# Patient Record
Sex: Female | Born: 1980 | Race: Black or African American | Hispanic: No | Marital: Single | Smoking: Never smoker
Health system: Southern US, Community
[De-identification: ages and names within clinical notes are randomized; demographics above are authoritative.]

## PROBLEM LIST (undated history)

## (undated) DIAGNOSIS — K760 Fatty (change of) liver, not elsewhere classified: Secondary | ICD-10-CM

## (undated) DIAGNOSIS — F419 Anxiety disorder, unspecified: Secondary | ICD-10-CM

## (undated) DIAGNOSIS — K279 Peptic ulcer, site unspecified, unspecified as acute or chronic, without hemorrhage or perforation: Secondary | ICD-10-CM

---

## 2018-03-23 ENCOUNTER — Emergency Department (HOSPITAL_COMMUNITY)
Admission: EM | Admit: 2018-03-23 | Discharge: 2018-03-25 | Disposition: A | Discharge status: Home / Self Care | Payer: Self-pay | Attending: Emergency Medicine | Admitting: Emergency Medicine

## 2018-03-23 ENCOUNTER — Encounter (HOSPITAL_COMMUNITY): Payer: Self-pay | Admitting: Emergency Medicine

## 2018-03-23 ENCOUNTER — Other Ambulatory Visit: Payer: Self-pay

## 2018-03-23 DIAGNOSIS — F419 Anxiety disorder, unspecified: Secondary | ICD-10-CM | POA: Insufficient documentation

## 2018-03-23 DIAGNOSIS — N390 Urinary tract infection, site not specified: Secondary | ICD-10-CM

## 2018-03-23 DIAGNOSIS — F4323 Adjustment disorder with mixed anxiety and depressed mood: Secondary | ICD-10-CM

## 2018-03-23 DIAGNOSIS — F22 Delusional disorders: Secondary | ICD-10-CM

## 2018-03-23 DIAGNOSIS — E876 Hypokalemia: Secondary | ICD-10-CM

## 2018-03-23 DIAGNOSIS — E860 Dehydration: Secondary | ICD-10-CM

## 2018-03-23 DIAGNOSIS — R000 Tachycardia, unspecified: Secondary | ICD-10-CM | POA: Insufficient documentation

## 2018-03-23 DIAGNOSIS — R4182 Altered mental status, unspecified: Secondary | ICD-10-CM | POA: Insufficient documentation

## 2018-03-23 HISTORY — DX: Peptic ulcer, site unspecified, unspecified as acute or chronic, without hemorrhage or perforation: K27.9

## 2018-03-23 HISTORY — DX: Fatty (change of) liver, not elsewhere classified: K76.0

## 2018-03-23 HISTORY — DX: Anxiety disorder, unspecified: F41.9

## 2018-03-23 LAB — CBC WITH DIFFERENTIAL/PLATELET
Abs Immature Granulocytes: 0.06 10*3/uL (ref 0.00–0.07)
Basophils Absolute: 0.1 10*3/uL (ref 0.0–0.1)
Basophils Relative: 1 %
Eosinophils Absolute: 0 10*3/uL (ref 0.0–0.5)
Eosinophils Relative: 0 %
HCT: 39.2 % (ref 36.0–46.0)
HEMOGLOBIN: 14.1 g/dL (ref 12.0–15.0)
Immature Granulocytes: 1 %
Lymphocytes Relative: 16 %
Lymphs Abs: 1.9 10*3/uL (ref 0.7–4.0)
MCH: 31.2 pg (ref 26.0–34.0)
MCHC: 36 g/dL (ref 30.0–36.0)
MCV: 86.7 fL (ref 80.0–100.0)
MONO ABS: 1 10*3/uL (ref 0.1–1.0)
Monocytes Relative: 9 %
Neutro Abs: 9 10*3/uL — ABNORMAL HIGH (ref 1.7–7.7)
Neutrophils Relative %: 73 %
Platelets: 295 10*3/uL (ref 150–400)
RBC: 4.52 MIL/uL (ref 3.87–5.11)
RDW: 13 % (ref 11.5–15.5)
WBC: 12 10*3/uL — ABNORMAL HIGH (ref 4.0–10.5)
nRBC: 0 % (ref 0.0–0.2)

## 2018-03-23 LAB — COMPREHENSIVE METABOLIC PANEL
ALK PHOS: 64 U/L (ref 38–126)
ALT: 11 U/L (ref 0–44)
AST: 19 U/L (ref 15–41)
Albumin: 4.9 g/dL (ref 3.5–5.0)
Anion gap: 14 (ref 5–15)
BUN: 13 mg/dL (ref 6–20)
CALCIUM: 9.4 mg/dL (ref 8.9–10.3)
CO2: 20 mmol/L — AB (ref 22–32)
Chloride: 103 mmol/L (ref 98–111)
Creatinine, Ser: 1.1 mg/dL — ABNORMAL HIGH (ref 0.44–1.00)
GFR calc Af Amer: >60 mL/min (ref >60)
GFR calc non Af Amer: >60 mL/min (ref >60)
Glucose, Bld: 150 mg/dL — ABNORMAL HIGH (ref 70–99)
Potassium: 2.8 mmol/L — ABNORMAL LOW (ref 3.5–5.1)
Sodium: 137 mmol/L (ref 135–145)
Total Bilirubin: 1.5 mg/dL — ABNORMAL HIGH (ref 0.3–1.2)
Total Protein: 8.1 g/dL (ref 6.5–8.1)

## 2018-03-23 LAB — HCG, QUANTITATIVE, PREGNANCY: hCG, Beta Chain, Quant, S: <1 m[IU]/mL (ref <5)

## 2018-03-23 LAB — ACETAMINOPHEN LEVEL: Acetaminophen (Tylenol), Serum: <10 ug/mL — ABNORMAL LOW (ref 10–30)

## 2018-03-23 LAB — SALICYLATE LEVEL: Salicylate Lvl: <7 mg/dL (ref 2.8–30.0)

## 2018-03-23 LAB — LIPASE, BLOOD: Lipase: 29 U/L (ref 11–51)

## 2018-03-23 LAB — ETHANOL: Alcohol, Ethyl (B): <10 mg/dL (ref <10)

## 2018-03-23 LAB — MAGNESIUM: MAGNESIUM: 1.8 mg/dL (ref 1.7–2.4)

## 2018-03-23 MED ORDER — ONDANSETRON HCL 4 MG/2ML IJ SOLN
4.0000 mg | Freq: Once | INTRAMUSCULAR | Status: DC
  Filled 2018-03-23: qty 2

## 2018-03-23 MED ORDER — LORAZEPAM 2 MG/ML IJ SOLN: 1.0000 mg | Freq: Once | INTRAMUSCULAR | Status: DC

## 2018-03-23 MED ORDER — POTASSIUM CHLORIDE CRYS ER 20 MEQ PO TBCR
40.0000 meq | EXTENDED_RELEASE_TABLET | Freq: Once | ORAL | Status: AC
  Administered 2018-03-23: 40 meq via ORAL
  Filled 2018-03-23: qty 2

## 2018-03-23 MED ORDER — SODIUM CHLORIDE 0.9 % IV BOLUS
1000.0000 mL | Freq: Once | INTRAVENOUS | Status: AC
  Administered 2018-03-23: 1000 mL via INTRAVENOUS

## 2018-03-23 NOTE — ED Notes (Signed)
Pt attempted to give urine sample but unable.

## 2018-03-23 NOTE — ED Triage Notes (Addendum)
Pt here voluntarily with police after she states she heard her family plotting to kill her. Pt went door to door in her neighborhood banging on doors. Pt has a history of anxiety but denies hx of psychosis. Pt mother told [employer] they were worried about her so they went to her apartment to check on her and she had all of her appliances unplugged. When asked about this, pt stated she is in the process of moving. Police were called and pt asked to come here to be treated for dehydration. Pt can not state why she feels dehydrated but she has felt this way for a few days. Pt denies emesis or diarrhea.   Pt's mother Erenest RasherChandra Zwiebel [phone] Pt's cousin Amado NashMaya Shepherd [phone]

## 2018-03-23 NOTE — ED Provider Notes (Signed)
[facility]-EMERGENCY DEPT Provider Note   CSN: [id number] Arrival date & time: 03/23/18  2021     History   Chief Complaint Chief Complaint  Patient presents with  . Medical Clearance  . Dehydration    HPI Suzanne [name] is a 37 y.o. female presenting for evaluation of possible dehydration and abnormal behavior.  Patient arrives with [employer] under voluntary commitment.  Per patient, she is here today because she had an altercation with her family and she is dehydrated.  Patient states that the past several days, she has been feeling nauseous, but no vomiting or abdominal pain.  As such, she has not been eating or drinking very well.  Her last meal was breakfast this morning, which she tolerated without difficulty.  Patient states she has a history of fatty liver disease and peptic ulcer, but takes no medications daily.  She denies recent fevers, chills, chest pain, shortness of breath, abdominal pain, abnormal bowel movements.  When asked if she has any urinary symptoms, patient states she is having dysuria, think she has a UTI.  She denies alcohol, drug, or tobacco use.  Patient states she is not sexually active, denies vaginal discharge. Pt denies a history of mental health problems.  Patient denies SI, HI, or AVH.  Per [employer], they were called to patient's residence for abnormal behavior.  Patient stated that while she was asleep she heard her family planning to kill her.  She subsequently knocked on all the neighbors doors saying that her family was trying to kill her.  When [employer] entered the residents, all of patient's appliances were unplugged, but she could not explain why.  HPI  Past Medical History:  Diagnosis Date  . Anxiety   . Fatty liver   . Peptic ulcer     There are no active problems to display for this patient.   History reviewed. No pertinent surgical history.   OB History   None      Home Medications    Prior to Admission medications   Not  on File    Family History No family history on file.  Social History Social History   Tobacco Use  . Smoking status: Never Smoker  . Smokeless tobacco: Never Used  Substance Use Topics  . Alcohol use: Never    Frequency: Never  . Drug use: Never     Allergies   Patient has no known allergies.   Review of Systems Review of Systems  Gastrointestinal: Positive for nausea.  Genitourinary: Positive for dysuria.  All other systems reviewed and are negative.    Physical Exam Updated Vital Signs BP (!) 139/112 (BP Location: Left Arm)   Pulse (!) 133   Temp 98 F (36.7 C) (Oral)   Resp 18   LMP 02/20/2018   SpO2 99%   Physical Exam  Constitutional: She is oriented to person, place, and time. She appears well-developed and well-nourished. No distress.  Appears nontoxic  HENT:  Head: Normocephalic and atraumatic.  MM moist  Eyes: Pupils are equal, round, and reactive to light. Conjunctivae and EOM are normal.  Neck: Normal range of motion. Neck supple.  Cardiovascular: Regular rhythm and intact distal pulses.  Tachycardic around 120  Pulmonary/Chest: Effort normal and breath sounds normal. No respiratory distress. She has no wheezes.  Abdominal: Soft. She exhibits no distension and no mass. There is no tenderness. There is no rebound and no guarding.  Musculoskeletal: Normal range of motion.  Neurological: She is alert and  oriented to person, place, and time.  Skin: Skin is warm and dry. Capillary refill takes less than 2 seconds.  Psychiatric: Her mood appears anxious. She is not actively hallucinating. She expresses no homicidal and no suicidal ideation. She expresses no suicidal plans and no homicidal plans.  Patient is calm and cooperative when I am in the room, however appears anxious.  Patient repeatedly states her family is trying to kill her.  Nursing note and vitals reviewed.    ED Treatments / Results  Labs (all labs ordered are listed, but only abnormal  results are displayed) Labs Reviewed  CBC WITH DIFFERENTIAL/PLATELET  COMPREHENSIVE METABOLIC PANEL  LIPASE, BLOOD  URINALYSIS, ROUTINE W REFLEX MICROSCOPIC  RAPID URINE DRUG SCREEN, HOSP PERFORMED  ETHANOL  SALICYLATE LEVEL  ACETAMINOPHEN LEVEL  HCG, QUANTITATIVE, PREGNANCY  I-STAT BETA HCG BLOOD, ED ([facility], [facility], [facility] ONLY)    EKG None  Radiology No results found.  Procedures Procedures (including critical care time)  Medications Ordered in ED Medications  sodium chloride 0.9 % bolus 1,000 mL (has no administration in time range)  ondansetron (ZOFRAN) injection 4 mg (4 mg Intravenous Not Given 03/23/18 2131)  LORazepam (ATIVAN) injection 1 mg (has no administration in time range)     Initial Impression / Assessment and Plan / ED Course  I have reviewed the triage vital signs and the nursing notes.  Pertinent labs & imaging results that were available during my care of the patient were reviewed by me and considered in my medical decision making (see chart for details).     Patient presenting for evaluation of abnormal behavior and possible dehydration.  Initial exam shows patient who is tachycardic, but otherwise appears nontoxic.  Tachycardia could be contributed to dehydration, but also consider anxiety for possible drug use.  Patient without mental health history in the medical chart.  Will obtain labs, urine, UDS, and CT head for further evaluation.  Informed by RN that patient was requesting to leave.  Discussed with patient that at this time I feel it is best that she stays in the hospital.  Offered for patient to either stay voluntarily or stated that due to her behavior we would need to take out an IVC. pt agrees to stay voluntarily.   Informed by RN that patient is attempting to leave again and she is refusing any treatment or scans.  IVC paperwork completed, Ativan given to help calm patient.  Pt signed out to Para Skeans, MD for f/u on labs and [facility] consult.    Final  Clinical Impressions(s) / ED Diagnoses   Final diagnoses:  None    ED Discharge Orders    None       Alveria Apley, PA-C 03/23/18 2203    Jacalyn Lefevre, MD 03/23/18 2353

## 2018-03-23 NOTE — ED Notes (Signed)
Pt came out with belongings and stated she was ready to leave/check out. Sophia, PA notified and at bedside speaking with pt.

## 2018-03-23 NOTE — ED Notes (Signed)
Pt attempted to run out of ED. [employer] and Security at bedside.

## 2018-03-24 ENCOUNTER — Emergency Department (HOSPITAL_COMMUNITY): Payer: Self-pay

## 2018-03-24 LAB — URINALYSIS, ROUTINE W REFLEX MICROSCOPIC
BILIRUBIN URINE: NEGATIVE
Glucose, UA: NEGATIVE mg/dL
Ketones, ur: 80 mg/dL — AB
Nitrite: NEGATIVE
Protein, ur: NEGATIVE mg/dL
Specific Gravity, Urine: 1.012 (ref 1.005–1.030)
pH: 6 (ref 5.0–8.0)

## 2018-03-24 LAB — RAPID URINE DRUG SCREEN, HOSP PERFORMED
Amphetamines: NOT DETECTED
Barbiturates: NOT DETECTED
Benzodiazepines: NOT DETECTED
Cocaine: NOT DETECTED
Opiates: NOT DETECTED
Tetrahydrocannabinol: NOT DETECTED

## 2018-03-24 LAB — POTASSIUM: POTASSIUM: 3 mmol/L — AB (ref 3.5–5.1)

## 2018-03-24 MED ORDER — LORAZEPAM 1 MG PO TABS
1.0000 mg | ORAL_TABLET | ORAL | Status: AC | PRN
  Administered 2018-03-24: 1 mg via ORAL
  Filled 2018-03-24: qty 1

## 2018-03-24 MED ORDER — RISPERIDONE 1 MG PO TBDP: 2.0000 mg | ORAL_TABLET | Freq: Three times a day (TID) | ORAL | Status: DC | PRN

## 2018-03-24 MED ORDER — POTASSIUM CHLORIDE CRYS ER 20 MEQ PO TBCR: 40.0000 meq | EXTENDED_RELEASE_TABLET | Freq: Once | ORAL | Status: DC

## 2018-03-24 MED ORDER — POTASSIUM CHLORIDE CRYS ER 20 MEQ PO TBCR
40.0000 meq | EXTENDED_RELEASE_TABLET | Freq: Two times a day (BID) | ORAL | Status: DC
  Administered 2018-03-24: 40 meq via ORAL
  Filled 2018-03-24: qty 2

## 2018-03-24 MED ORDER — ACETAMINOPHEN 325 MG PO TABS: 650.0000 mg | ORAL_TABLET | ORAL | Status: DC | PRN

## 2018-03-24 MED ORDER — ZOLPIDEM TARTRATE 5 MG PO TABS: 5.0000 mg | ORAL_TABLET | Freq: Every evening | ORAL | Status: DC | PRN

## 2018-03-24 MED ORDER — LORAZEPAM 1 MG PO TABS
1.0000 mg | ORAL_TABLET | Freq: Four times a day (QID) | ORAL | Status: DC
  Administered 2018-03-24 – 2018-03-25 (×4): 1 mg via ORAL
  Filled 2018-03-24 (×5): qty 1

## 2018-03-24 MED ORDER — ONDANSETRON HCL 4 MG PO TABS: 4.0000 mg | ORAL_TABLET | Freq: Three times a day (TID) | ORAL | Status: DC | PRN

## 2018-03-24 MED ORDER — ZIPRASIDONE MESYLATE 20 MG IM SOLR: 20.0000 mg | INTRAMUSCULAR | Status: DC | PRN

## 2018-03-24 MED ORDER — POTASSIUM CHLORIDE CRYS ER 20 MEQ PO TBCR
40.0000 meq | EXTENDED_RELEASE_TABLET | Freq: Once | ORAL | Status: AC
  Administered 2018-03-25: 40 meq via ORAL
  Filled 2018-03-24: qty 2

## 2018-03-24 NOTE — ED Notes (Signed)
Pt alert and cooperative at this time. Pt denies any si, hi, or avh. Pt resting in bed at this time. Will continue to monitor.

## 2018-03-24 NOTE — ED Notes (Signed)
Pt finally reluctantly took her medications.  Pt continues to appear very paranoid.

## 2018-03-24 NOTE — ED Notes (Signed)
Phlebotomy called to obtain potasium.

## 2018-03-24 NOTE — ED Notes (Signed)
Bed: [facility]39 Expected date:  Expected time:  Means of arrival:  Comments: 

## 2018-03-24 NOTE — ED Notes (Signed)
Pt would not talk with the providers when they made rounds. As they asked questions she remained silent. At one point she said, "Come back in 5 minutes." Unable to assess pt at this time. Pt has remained in bed except to ask for clean socks and use the bathroom.

## 2018-03-24 NOTE — ED Notes (Signed)
Pt pulled IV out 

## 2018-03-24 NOTE — ED Notes (Signed)
Refused vitals 

## 2018-03-24 NOTE — ED Notes (Signed)
Came up to nurses station asking to call her mom that she had to get out of here. Informed she was on a petition and she couldn't leave now. Asked her if she would like to reconsider taking something for her anxiety as she looks anxious and it will also help her sleep and she can talk with the Dr in the am and make her case re her desire to leave. She agreed to take meds and was given a po Ativan. She reported she has OCD in the form of order and it was bothering now. She denied any hallucinations and she denied SI and HI. Offered several self soothing skills ie showering, hot pack, TV or a magazine. She asked to take a shower and told to come to station to get hygiene items but never came up.

## 2018-03-24 NOTE — ED Notes (Signed)
Up to use the bathroom, still has not provided an urine sample. Asked her if she felt better and said she did, but she continues to be tremulous in bed and is slow to respond but denies any sx of psychosis and denies dangerousness. Asked if she would like to take medicine now to help her sleep and she declined saying she was fine. Resting quietly but awake in bed.

## 2018-03-24 NOTE — ED Notes (Signed)
Pt was asked to provide a urine sample. Pt states she will provide one shortly

## 2018-03-24 NOTE — BH Assessment (Signed)
[name] Patient's mother Erenest RasherChandra Vanzandt [phone] spoke with this writer this date to render collateral information on patient. Mother reports she has not heard from her daughter in over one week and when mother drove down from [place] a scheduled visit (Thanksgiving) daughter was found at her apartment presenting with a very bizarre affect and was observed to be very paranoid asking mother to turn off all her electronics due to patient reporting her phone calls were being monitored. Mother also reported all the electrical appliances in the residence were unplugged. Mother reported patient look disheveled and observed notable weight loss since she last saw the patient a month ago. Mother reports patient had put some of her electronic devices out in her yard. When mother questioned patient mother reported patient became upset and went to the neighbors residence who contacted EMS after patient started voicing her paranoia to neighbors.

## 2018-03-24 NOTE — ED Notes (Signed)
Attempted to give medications and to obtain urine.  Pt acts as if she is going to comply and suddenly comes up with an excuse to prolong the wait.  Pt states " I need about an hour and a half to get myself together, then I'll take the meds."   Pt is standing in hallway holding a cup of water and a cup of ginger ale.  She is very bizarre.  We will continue to try to get pt to cooperate.

## 2018-03-24 NOTE — ED Notes (Signed)
Admitted to room 39 from ED. She is tremulous, reports highly anxious, indecisive, worrisome. She accepted food second time offered. Offered her something for anxiety but hesitant to take it, when order checked IM only waiting on Dr for a po order. Cooperative, but nervous. Resting in bed. Able to contract for safety at this time.

## 2018-03-24 NOTE — BH Assessment (Addendum)
Assessment Note  Suzanne [name] is an 37 y.o. female, who [facility] Pt was a poor historian during the assessment. Pt answered very few questions during the assessment. Clinician asked the pt, "what brought you to the hospital?" Pt took a deep breath, closed her eyes and after a long pause, reported, "I had an altercation with my family, I got scared, I asked the police to take me here." Clinician asked the pt, "what made you scared?" Pt closed her eyes and did not answer the question when prompted. Pt denies, SI and HI.  Clinician was unable to assess the following: "martial status, legal guardian, living situation, linkage to OPT resources, previous suicide attempts, legal involvement, stressors, family supports, sleep, appetite, history of abuse, substance use, symptoms of depression, previous inpatient admissions, access to weapons, AVH, self-injurious behaviors, contract for safety, impulse control, DSS involvement, family history of suicide."    Pt presents quiet/awake in scrubs under the covers with logical/coherent speech. Pt's eye contact was poor. Pt's mood was preoccupied. Pt's affect was flat. Pt's thought process was circumstantial. Pt's judgement was impaired. Pt was oriented x2. Pt's concentration and insight are poor.   Diagnosis: Deferred.  Past Medical History:  Past Medical History:  Diagnosis Date  . Anxiety   . Fatty liver   . Peptic ulcer     History reviewed. No pertinent surgical history.  Family History: No family history on file.  Social History:  reports that she has never smoked. She has never used smokeless tobacco. She reports that she does not drink alcohol or use drugs.  Additional Social History:  Alcohol / Drug Use Pain Medications: See MAR Prescriptions: See MAR Over the Counter: See MAR History of alcohol / drug use?: (UTA)  CIWA: CIWA-Ar BP: (!) 139/112 Pulse Rate: (!) 133 COWS:    Allergies: No Known  Allergies  Home Medications:  (Not in a hospital admission)  OB/GYN Status:  Patient's last menstrual period was 02/20/2018.  General Assessment Data Location of Assessment: [facility] ED TTS Assessment: In system Is this a Tele or Face-to-Face Assessment?: Face-to-Face Is this an Initial Assessment or a Re-assessment for this encounter?: Initial Assessment Patient Accompanied by:: N/A Language Other than English: No Living Arrangements: Other (Comment)(UTA) What gender do you identify as?: Female Marital status: Other (comment)(UTA) Living Arrangements: Other (Comment)(UTA) Can pt return to current living arrangement?: (UTA) Admission Status: Involuntary Petitioner: Police Is patient capable of signing voluntary admission?: No Referral Source: Self/Family/Friend Insurance type: Self-pay.      Crisis Care Plan Living Arrangements: Other (Comment)(UTA) Legal Guardian: Other:(UTA) Name of Psychiatrist: UTA Name of Therapist: UTA  Education Status Is patient currently in school?: No Is the patient employed, unemployed or receiving disability?: (UTA)  Risk to self with the past 6 months Suicidal Ideation: No(Pt denies. ) Has patient been a risk to self within the past 6 months prior to admission? : No Suicidal Intent: No Has patient had any suicidal intent within the past 6 months prior to admission? : No Is patient at risk for suicide?: No Suicidal Plan?: No Has patient had any suicidal plan within the past 6 months prior to admission? : No Access to Means: No What has been your use of drugs/alcohol within the last 12 months?: UDS is pending.  Previous Attempts/Gestures: (UTA) How many times?: (UTA) Other Self Harm Risks: UTA Intentional Self Injurious Behavior: (UTA) Family Suicide History: Unable to assess Recent stressful life event(s): Other (Comment)(UTA) Persecutory voices/beliefs?: Yes(Per chart,  thinking her family is trying to kill her. ) Depression:  (UTA) Substance abuse history and/or treatment for substance abuse?: (UTA) Suicide prevention information given to non-admitted patients: Not applicable  Risk to Others within the past 6 months Homicidal Ideation: No(Pt denies. ) Does patient have any lifetime risk of violence toward others beyond the six months prior to admission? : (UTA) Thoughts of Harm to Others: No Current Homicidal Intent: No Current Homicidal Plan: No Access to Homicidal Means: (UTA) Identified Victim: NA History of harm to others?: (UTA) Assessment of Violence: (UTA) Violent Behavior Description: UTA Does patient have access to weapons?: (UTA) Criminal Charges Pending?: (UTA) Does patient have a court date: (UTA) Is patient on probation?: (UTA)     Mental Status Report Appearance/Hygiene: In scrubs Eye Contact: Poor Motor Activity: Unremarkable Speech: Logical/coherent Level of Consciousness: Quiet/awake Mood: Preoccupied Affect: Flat Anxiety Level: Minimal Thought Processes: Circumstantial Judgement: Impaired Orientation: Person, Place Obsessive Compulsive Thoughts/Behaviors: None  Cognitive Functioning Concentration: Poor Memory: Recent Impaired, Remote Impaired Is patient IDD: No Insight: Poor Impulse Control: Unable to Assess Appetite: (UTA) Sleep: Unable to Assess Vegetative Symptoms: Unable to Assess  ADLScreening [facility] Assessment Services) Patient's cognitive ability adequate to safely complete daily activities?: Yes Patient able to express need for assistance with ADLs?: Yes Independently performs ADLs?: Yes (appropriate for developmental age)  Prior Inpatient Therapy Prior Inpatient Therapy: (UTA)  Prior Outpatient Therapy Prior Outpatient Therapy: (UTA)  ADL Screening (condition at time of admission) Patient's cognitive ability adequate to safely complete daily activities?: Yes Is the patient deaf or have difficulty hearing?: No Does the patient have difficulty seeing, even  when wearing glasses/contacts?: Yes(Pt wears glasses. ) Does the patient have difficulty concentrating, remembering, or making decisions?: Yes Patient able to express need for assistance with ADLs?: Yes Does the patient have difficulty dressing or bathing?: No Independently performs ADLs?: Yes (appropriate for developmental age) Does the patient have difficulty walking or climbing stairs?: No Weakness of Legs: (UTA) Weakness of Arms/Hands: (UTA)  Home Assistive Devices/Equipment Home Assistive Devices/Equipment: Eyeglasses    Abuse/Neglect Assessment (Assessment to be complete while patient is alone) Abuse/Neglect Assessment Can Be Completed: Unable to assess, patient is non-responsive or altered mental status     Advance Directives (For Healthcare) Does Patient Have a Medical Advance Directive?: No Would patient like information on creating a medical advance directive?: No - Patient declined          Disposition: Nira Conn, NP recommends inpatient treatment. Disposition discussed with Dr. Rhunette Croft and Fannie Knee, RN. TTS to seek placement.   Disposition Initial Assessment Completed for this Encounter: Yes  On Site Evaluation by: Redmond Pulling, MS, LPC, CRC. Reviewed with Physician: Dr. Marja Kays and Nira Conn, NP.  Redmond Pulling 03/24/2018 1:54 AM   Redmond Pulling, MS, [facility], [facility] Triage Specialist [phone]

## 2018-03-25 DIAGNOSIS — N390 Urinary tract infection, site not specified: Secondary | ICD-10-CM | POA: Diagnosis present

## 2018-03-25 DIAGNOSIS — F4323 Adjustment disorder with mixed anxiety and depressed mood: Secondary | ICD-10-CM | POA: Diagnosis present

## 2018-03-25 MED ORDER — CEPHALEXIN 500 MG PO CAPS: 500.0000 mg | ORAL_CAPSULE | Freq: Three times a day (TID) | ORAL | 0 refills | Status: DC

## 2018-03-25 MED ORDER — CEPHALEXIN 500 MG PO CAPS
500.0000 mg | ORAL_CAPSULE | Freq: Three times a day (TID) | ORAL | Status: DC
  Administered 2018-03-25: 500 mg via ORAL
  Filled 2018-03-25: qty 1

## 2018-03-25 NOTE — ED Notes (Addendum)
Attempted to take vital signs on patient in hallway.  Social worker walked by and heard that patient was having a difficult time getting home.  Social worker, Claris GowerCharlotte, offered to get involved in her discharge and help with transportation.  Patient then began to say that she felt too bad to go home and she wants to see her medical records and did we check with her doctors to see what mediation she was on.  Patient reassured that pharmacy does gather information as to what medications patients are on.  Patient also reports she wants to talk to the doctor about not going home.  Claris GowerCharlotte, Child psychotherapistsocial worker, said she could ask but could not promise she could get doctors to talk with her.

## 2018-03-25 NOTE — ED Notes (Signed)
When talking to patient about her discharge and reviewing her discharge medications, patient said to nurse, "will you just back off I feel like I am going to have a panic attack." "Just give me a few minutes."  Nurse was trying to get information about how patient was going to get home as well.  Patient answered no when asked if she had anyone to pick her up.

## 2018-03-25 NOTE — ED Notes (Addendum)
Patient not eating any of her lunch.  Encouraged fluids.

## 2018-03-25 NOTE — Consult Note (Addendum)
[facility]  03/25/2018 2:44 PM Suzanne [name]  MRN:  [id number] Principal Problem: Adjustment disorder with mixed anxiety and depressed mood Discharge Diagnoses: Principal Problem:   Adjustment disorder with mixed anxiety and depressed mood Active Problems:   UTI (urinary tract infection)  Subjective: 37 yo female who presented to the ED with dehydration and an altercation with her family.  She also had an UTI and was treated along with her dehydration.  This morning she was still a bit weak but this afternoon after many fluids and food, she is better and in her room looking at a magazine.  Yesterday she thought her family was out to get her due to her medical state.  Today, those symptoms dissipated and she feels better.  No paranoia or suicidal/homicidal ideations, hallucinations, or substance abuse.  Her paranoia started yesterday with her dehydration and dissipated after potassium and fluids.  Denies this has ever happened in the past, stable for discharge.  Total Time spent with patient: 45 minutes  Past Psychiatric History: anxiety  Past Medical History:  Past Medical History:  Diagnosis Date  . Anxiety   . Fatty liver   . Peptic ulcer    History reviewed. No pertinent surgical history. Family History: No family history on file. Family Psychiatric  History: none Social History:  Social History   Substance and Sexual Activity  Alcohol Use Never  . Frequency: Never    Social History   Substance and Sexual Activity  Drug Use Never   Social History   Socioeconomic History  . Marital status: Single    Spouse name: Not on file  . Number of children: Not on file  . Years of education: Not on file  . Highest education level: Not on file  Occupational History  . Not on file  Social Needs  . Financial resource strain: Not on file  . Food insecurity:    Worry: Not on file    Inability: Not on file  . Transportation needs:    Medical: Not on file    Non-medical:  Not on file  Tobacco Use  . Smoking status: Never Smoker  . Smokeless tobacco: Never Used  Substance and Sexual Activity  . Alcohol use: Never    Frequency: Never  . Drug use: Never  . Sexual activity: Not on file  Lifestyle  . Physical activity:    Days per week: Not on file    Minutes per session: Not on file  . Stress: Not on file  Relationships  . Social connections:    Talks on phone: Not on file    Gets together: Not on file    Attends religious service: Not on file    Active member of club or organization: Not on file    Attends meetings of clubs or organizations: Not on file    Relationship status: Not on file  Other Topics Concern  . Not on file  Social History Narrative  . Not on file    Has this patient used any form of tobacco in the last 30 days? (Cigarettes, Smokeless Tobacco, Cigars, and/or Pipes) NA  Current Medications: Current Facility-Administered Medications  Medication Dose Route Frequency Provider Last Rate Last Dose  . acetaminophen (TYLENOL) tablet 650 mg  650 mg Oral Q4H PRN Nanavati, Ankit, MD      . cephALEXin (KEFLEX) capsule 500 mg  500 mg Oral Q8H Charm RingsLord, Jamison Y, NP   500 mg at 03/25/18 1032  . LORazepam (ATIVAN) tablet  1 mg  1 mg Oral Q6H Laveda Abbe, NP   1 mg at 03/25/18 1215  . ondansetron (ZOFRAN) injection 4 mg  4 mg Intravenous Once Caccavale, Sophia, PA-C      . ondansetron (ZOFRAN) tablet 4 mg  4 mg Oral Q8H PRN Derwood Kaplan, MD       No current outpatient medications on file.   PTA Medications:  (Not in a hospital admission)  Musculoskeletal: Strength & Muscle Tone: within normal limits Gait & Station: normal Patient leans: N/A  Psychiatric Specialty Exam: Physical Exam  Nursing note and vitals reviewed. Constitutional: She is oriented to person, place, and time. She appears well-developed and well-nourished.  HENT:  Head: Normocephalic.  Neck: Normal range of motion.  Respiratory: Effort normal.   Musculoskeletal: Normal range of motion.  Neurological: She is alert and oriented to person, place, and time.  Psychiatric: Her speech is normal and behavior is normal. Judgment and thought content normal. Her mood appears anxious. Cognition and memory are normal. She exhibits a depressed mood.    Review of Systems  Genitourinary: Positive for dysuria, frequency and urgency.  Psychiatric/Behavioral: Positive for depression. The patient is nervous/anxious.   All other systems reviewed and are negative.   Blood pressure 107/82, pulse 80, temperature 98.2 F (36.8 C), temperature source Oral, resp. rate 14, last menstrual period 02/20/2018, SpO2 99 %.There is no height or weight on file to calculate BMI.  General Appearance: Casual  Eye Contact:  Good  Speech:  Normal Rate  Volume:  Normal  Mood:  Anxious and Depressed, mild  Affect:  Congruent  Thought Process:  Coherent and Descriptions of Associations: Intact  Orientation:  Full (Time, Place, and Person)  Thought Content:  WDL and Logical  Suicidal Thoughts:  No  Homicidal Thoughts:  No  Memory:  Immediate;   Good Recent;   Good Remote;   Good  Judgement:  Fair  Insight:  Good  Psychomotor Activity:  Normal  Concentration:  Concentration: Good and Attention Span: Good  Recall:  Good  Fund of Knowledge:  Good  Language:  Good  Akathisia:  No  Handed:  Right  AIMS (if indicated):     Assets:  Leisure Time Physical Health Resilience Social Support  ADL's:  Intact  Cognition:  WNL  Sleep:        Demographic Factors:  Living alone  Loss Factors: NA  Historical Factors: NA  Risk Reduction Factors:   Sense of responsibility to family, Employed and Positive social support  Continued Clinical Symptoms:  Depression and anxiety, mild  Cognitive Features That Contribute To Risk:  None    Suicide Risk:  Minimal: No identifiable suicidal ideation.  Patients presenting with no risk factors but with morbid  ruminations; may be classified as minimal risk based on the severity of the depressive symptoms   Plan Of Care/Follow-up recommendations:  Adjustment disorder with mixed disturbance of depression and anxiety:  UTI: -Started Keflex 500 mg every 8 hours for ten days  Dehydration: -Potassium provided Activity:  as tolerated Diet:  heart healthy diet  Disposition: discharge home   Nanine Means, NP 03/25/2018, 2:44 PM  Patient seen face-to-face for psychiatric evaluation, chart reviewed and case discussed with the physician extender and developed treatment plan. Reviewed the information documented and agree with the treatment plan. Thedore Mins, MD

## 2018-03-25 NOTE — Progress Notes (Signed)
Subjective/Objective No new subjective & objective note has been filed under this hospital service since the last note was generated.   Scheduled Meds: . cephALEXin  500 mg Oral Q8H  . LORazepam  1 mg Oral Q6H  . ondansetron (ZOFRAN) IV  4 mg Intravenous Once   Continuous Infusions: PRN Meds:acetaminophen, ondansetron  Vital signs in last 24 hours: Temp:  [98.2 F (36.8 C)] 98.2 F (36.8 C) (11/30 16100632) Pulse Rate:  [80] 80 (11/30 0632) Resp:  [14] 14 (11/30 96040632) BP: (107)/(82) 107/82 (11/30 54090632) SpO2:  [99 %] 99 % (11/30 81190632)  Intake/Output last 3 shifts: No intake/output data recorded. Intake/Output this shift: No intake/output data recorded.  Problem Assessment/Plan No new Assessment & Plan notes have been filed under this hospital service since the last note was generated. Service: Psychiatry

## 2018-03-25 NOTE — ED Notes (Signed)
Patient's reluctance to go home communicated to NP, Nanine MeansJamison Lord.  NP said to continue with discharge.

## 2018-03-25 NOTE — Progress Notes (Signed)
CSW and nursing spoke with patient in hallway, patient stating she refuses to be discharged. Patient initially stated she had no way to get home, CSW offered bus pass or taxi voucher, patient declined. Patient demanding to speak with doctor and view her medical records, CSW explained that patient would be able to request her medical records following discharge.  Suzanne Cutterharlotte Telly Broberg, LCSW-A Clinical Social Worker [phone]

## 2018-03-26 ENCOUNTER — Other Ambulatory Visit: Payer: Self-pay

## 2018-03-26 ENCOUNTER — Emergency Department (HOSPITAL_COMMUNITY)
Admission: EM | Admit: 2018-03-26 | Discharge: 2018-03-26 | Disposition: A | Discharge status: Other Acute Inpatient Hospital | Payer: Self-pay | Attending: Emergency Medicine | Admitting: Emergency Medicine

## 2018-03-26 ENCOUNTER — Inpatient Hospital Stay (HOSPITAL_COMMUNITY)
Admission: AD | Admit: 2018-03-26 | Discharge: 2018-04-04 | DRG: 885 | Disposition: A | Discharge status: Home / Self Care | Payer: Federal, State, Local not specified - Other | Source: Intra-hospital | Attending: Psychiatry | Admitting: Psychiatry

## 2018-03-26 ENCOUNTER — Encounter (HOSPITAL_COMMUNITY): Payer: Self-pay | Admitting: Behavioral Health

## 2018-03-26 DIAGNOSIS — F209 Schizophrenia, unspecified: Secondary | ICD-10-CM | POA: Diagnosis present

## 2018-03-26 DIAGNOSIS — K760 Fatty (change of) liver, not elsewhere classified: Secondary | ICD-10-CM | POA: Diagnosis present

## 2018-03-26 DIAGNOSIS — F333 Major depressive disorder, recurrent, severe with psychotic symptoms: Secondary | ICD-10-CM | POA: Diagnosis not present

## 2018-03-26 DIAGNOSIS — F418 Other specified anxiety disorders: Secondary | ICD-10-CM | POA: Diagnosis present

## 2018-03-26 DIAGNOSIS — Z9119 Patient's noncompliance with other medical treatment and regimen: Secondary | ICD-10-CM | POA: Insufficient documentation

## 2018-03-26 DIAGNOSIS — E876 Hypokalemia: Secondary | ICD-10-CM | POA: Diagnosis present

## 2018-03-26 DIAGNOSIS — Z590 Homelessness: Secondary | ICD-10-CM | POA: Diagnosis not present

## 2018-03-26 DIAGNOSIS — R45851 Suicidal ideations: Secondary | ICD-10-CM | POA: Insufficient documentation

## 2018-03-26 DIAGNOSIS — R419 Unspecified symptoms and signs involving cognitive functions and awareness: Secondary | ICD-10-CM | POA: Insufficient documentation

## 2018-03-26 DIAGNOSIS — Z79899 Other long term (current) drug therapy: Secondary | ICD-10-CM | POA: Insufficient documentation

## 2018-03-26 DIAGNOSIS — R000 Tachycardia, unspecified: Secondary | ICD-10-CM | POA: Insufficient documentation

## 2018-03-26 LAB — COMPREHENSIVE METABOLIC PANEL
ALK PHOS: 59 U/L (ref 38–126)
ALT: 17 U/L (ref 0–44)
AST: 45 U/L — ABNORMAL HIGH (ref 15–41)
Albumin: 4.9 g/dL (ref 3.5–5.0)
Anion gap: 16 — ABNORMAL HIGH (ref 5–15)
BUN: 18 mg/dL (ref 6–20)
CO2: 21 mmol/L — ABNORMAL LOW (ref 22–32)
Calcium: 9.7 mg/dL (ref 8.9–10.3)
Chloride: 103 mmol/L (ref 98–111)
Creatinine, Ser: 0.92 mg/dL (ref 0.44–1.00)
GFR calc Af Amer: >60 mL/min (ref >60)
GFR calc non Af Amer: >60 mL/min (ref >60)
Glucose, Bld: 104 mg/dL — ABNORMAL HIGH (ref 70–99)
Potassium: 3.2 mmol/L — ABNORMAL LOW (ref 3.5–5.1)
SODIUM: 140 mmol/L (ref 135–145)
Total Bilirubin: 1.4 mg/dL — ABNORMAL HIGH (ref 0.3–1.2)
Total Protein: 7.7 g/dL (ref 6.5–8.1)

## 2018-03-26 LAB — CBC WITH DIFFERENTIAL/PLATELET
ABS IMMATURE GRANULOCYTES: 0.05 10*3/uL (ref 0.00–0.07)
Basophils Absolute: 0.1 10*3/uL (ref 0.0–0.1)
Basophils Relative: 0 %
Eosinophils Absolute: 0 10*3/uL (ref 0.0–0.5)
Eosinophils Relative: 0 %
HCT: 35.8 % — ABNORMAL LOW (ref 36.0–46.0)
Hemoglobin: 12.5 g/dL (ref 12.0–15.0)
Immature Granulocytes: 0 %
Lymphocytes Relative: 14 %
Lymphs Abs: 1.8 10*3/uL (ref 0.7–4.0)
MCH: 30.9 pg (ref 26.0–34.0)
MCHC: 34.9 g/dL (ref 30.0–36.0)
MCV: 88.4 fL (ref 80.0–100.0)
Monocytes Absolute: 1.3 10*3/uL — ABNORMAL HIGH (ref 0.1–1.0)
Monocytes Relative: 10 %
Neutro Abs: 9.9 10*3/uL — ABNORMAL HIGH (ref 1.7–7.7)
Neutrophils Relative %: 76 %
Platelets: 245 10*3/uL (ref 150–400)
RBC: 4.05 MIL/uL (ref 3.87–5.11)
RDW: 13.4 % (ref 11.5–15.5)
WBC: 13.1 10*3/uL — ABNORMAL HIGH (ref 4.0–10.5)
nRBC: 0 % (ref 0.0–0.2)

## 2018-03-26 LAB — ETHANOL: Alcohol, Ethyl (B): <10 mg/dL (ref <10)

## 2018-03-26 LAB — I-STAT BETA HCG BLOOD, ED (MC, WL, AP ONLY): I-stat hCG, quantitative: <5 m[IU]/mL (ref <5)

## 2018-03-26 MED ORDER — CITALOPRAM HYDROBROMIDE 10 MG PO TABS
10.0000 mg | ORAL_TABLET | Freq: Every day | ORAL | Status: DC
  Filled 2018-03-26: qty 1

## 2018-03-26 MED ORDER — RISPERIDONE 0.5 MG PO TABS
0.5000 mg | ORAL_TABLET | Freq: Two times a day (BID) | ORAL | Status: DC
  Filled 2018-03-26: qty 1

## 2018-03-26 MED ORDER — MAGNESIUM HYDROXIDE 400 MG/5ML PO SUSP: 30.0000 mL | Freq: Every day | ORAL | Status: DC | PRN

## 2018-03-26 MED ORDER — ACETAMINOPHEN 325 MG PO TABS: 650.0000 mg | ORAL_TABLET | Freq: Four times a day (QID) | ORAL | Status: DC | PRN

## 2018-03-26 MED ORDER — POTASSIUM CHLORIDE 10 MEQ/100ML IV SOLN
10.0000 meq | INTRAVENOUS | Status: DC
  Filled 2018-03-26 (×4): qty 100

## 2018-03-26 MED ORDER — ALUM & MAG HYDROXIDE-SIMETH 200-200-20 MG/5ML PO SUSP: 30.0000 mL | ORAL | Status: DC | PRN

## 2018-03-26 MED ORDER — LORAZEPAM 1 MG PO TABS
1.0000 mg | ORAL_TABLET | Freq: Once | ORAL | Status: AC
  Administered 2018-03-26: 1 mg via ORAL
  Filled 2018-03-26: qty 1

## 2018-03-26 MED ORDER — TRAZODONE HCL 50 MG PO TABS: 50.0000 mg | ORAL_TABLET | Freq: Every evening | ORAL | Status: DC | PRN

## 2018-03-26 NOTE — Tx Team (Signed)
Initial Treatment Plan 03/26/2018 6:30 PM Suzanne [name] WUJ:[id number]    PATIENT STRESSORS: Marital or family conflict Medication change or noncompliance   PATIENT STRENGTHS: Ability for insight Capable of independent living   PATIENT IDENTIFIED PROBLEMS: "family conflict"  "money"  "mood swings"                 DISCHARGE CRITERIA:  Ability to meet basic life and health needs Adequate post-discharge living arrangements Improved stabilization in mood, thinking, and/or behavior  PRELIMINARY DISCHARGE PLAN: Attend aftercare/continuing care group Attend PHP/IOP  PATIENT/FAMILY INVOLVEMENT: This treatment plan has been presented to and reviewed with the patient, Suzanne [name], and/or family member.  The patient and family have been given the opportunity to ask questions and make suggestions.  Layla BarterWhite, Dorothe Elmore L, RN 03/26/2018, 6:30 PM

## 2018-03-26 NOTE — ED Provider Notes (Signed)
[facility]-EMERGENCY DEPT Provider Note   CSN: [id number] Arrival date & time: 03/26/18  1220     History   Chief Complaint Chief Complaint  Patient presents with  . Suicidal    HPI Suzanne [name] is a 37 y.o. female.  The history is provided by the patient.  Mental Health Problem  Presenting symptoms: suicidal thoughts and suicide attempt   Patient accompanied by:  Law enforcement Degree of incapacity (severity):  Severe Onset quality:  Sudden Timing:  Constant Progression:  Unchanged Chronicity:  Recurrent Context: noncompliance   Treatment compliance:  Untreated Relieved by:  Nothing Worsened by:  Family interactions Associated symptoms: feelings of worthlessness and poor judgment   Associated symptoms: no abdominal pain, no anhedonia, no anxiety, no appetite change, no chest pain, not distractible, no euphoric mood, no headaches, no hypersomnia, no hyperventilation and no insomnia   Risk factors: hx of mental illness     Past Medical History:  Diagnosis Date  . Anxiety   . Fatty liver   . Peptic ulcer     Patient Active Problem List   Diagnosis Date Noted  . UTI (urinary tract infection) 03/25/2018  . Adjustment disorder with mixed anxiety and depressed mood 03/25/2018    No past surgical history on file.   OB History   None      Home Medications    Prior to Admission medications   Medication Sig Start Date End Date Taking? Authorizing Provider  cephALEXin (KEFLEX) 500 MG capsule Take 1 capsule (500 mg total) by mouth every 8 (eight) hours. 03/25/18   Charm RingsLord, Jamison Y, NP    Family History No family history on file.  Social History Social History   Tobacco Use  . Smoking status: Never Smoker  . Smokeless tobacco: Never Used  Substance Use Topics  . Alcohol use: Not Currently    Frequency: Never    Comment: Last use was March 2019  . Drug use: Never     Allergies   Patient has no known allergies.   Review of  Systems Review of Systems  Constitutional: Negative for appetite change, chills and fever.  HENT: Negative for ear pain and sore throat.   Eyes: Negative for pain and visual disturbance.  Respiratory: Negative for cough and shortness of breath.   Cardiovascular: Negative for chest pain and palpitations.  Gastrointestinal: Negative for abdominal pain and vomiting.  Genitourinary: Negative for dysuria and hematuria.  Musculoskeletal: Negative for arthralgias and back pain.  Skin: Negative for color change and rash.  Neurological: Negative for seizures, syncope and headaches.  Psychiatric/Behavioral: Positive for suicidal ideas. The patient is not nervous/anxious and does not have insomnia.   All other systems reviewed and are negative.    Physical Exam Updated Vital Signs  ED Triage Vitals  Enc Vitals Group     BP 03/26/18 1302 111/77     Pulse Rate 03/26/18 1302 (!) 120     Resp 03/26/18 1302 16     Temp 03/26/18 1305 97.7 F (36.5 C)     Temp Source 03/26/18 1305 Oral     SpO2 03/26/18 1302 98 %     Weight 03/26/18 1316 180 lb (81.6 kg)     Height 03/26/18 1316 5\' 4"  (1.626 m)     Head Circumference --      Peak Flow --      Pain Score 03/26/18 1303 0     Pain Loc --      Pain Edu? --  Excl. in GC? --     Physical Exam  Constitutional: She is oriented to person, place, and time. She appears well-developed and well-nourished. No distress.  HENT:  Head: Normocephalic and atraumatic.  Eyes: Pupils are equal, round, and reactive to light. Conjunctivae and EOM are normal.  Neck: Normal range of motion. Neck supple.  Cardiovascular: Regular rhythm. Tachycardia present.  No murmur heard. Pulmonary/Chest: Effort normal and breath sounds normal. No respiratory distress.  Abdominal: Soft. Bowel sounds are normal. She exhibits no distension. There is no tenderness.  Musculoskeletal: Normal range of motion. She exhibits no edema.  Neurological: She is alert and oriented to  person, place, and time.  Skin: Skin is warm and dry. Capillary refill takes less than 2 seconds.  Psychiatric: Her mood appears anxious. She expresses impulsivity. She exhibits a depressed mood. She expresses suicidal ideation. She expresses no homicidal ideation. She expresses no suicidal plans and no homicidal plans.  Nursing note and vitals reviewed.    ED Treatments / Results  Labs (all labs ordered are listed, but only abnormal results are displayed) Labs Reviewed  COMPREHENSIVE METABOLIC PANEL - Abnormal; Notable for the following components:      Result Value   Potassium 3.2 (*)    CO2 21 (*)    Glucose, Bld 104 (*)    AST 45 (*)    Total Bilirubin 1.4 (*)    Anion gap 16 (*)    All other components within normal limits  CBC WITH DIFFERENTIAL/PLATELET - Abnormal; Notable for the following components:   WBC 13.1 (*)    HCT 35.8 (*)    Neutro Abs 9.9 (*)    Monocytes Absolute 1.3 (*)    All other components within normal limits  ETHANOL  RAPID URINE DRUG SCREEN, HOSP PERFORMED  URINALYSIS, ROUTINE W REFLEX MICROSCOPIC  I-STAT BETA HCG BLOOD, ED ([facility], [facility], [facility] ONLY)    EKG None  Radiology No results found.  Procedures Procedures (including critical care time)  Medications Ordered in ED Medications  citalopram (CELEXA) tablet 10 mg (10 mg Oral Given 03/26/18 1541)  risperiDONE (RISPERDAL) tablet 0.5 mg (0.5 mg Oral Given 03/26/18 1541)  LORazepam (ATIVAN) tablet 1 mg (1 mg Oral Given 03/26/18 1344)     Initial Impression / Assessment and Plan / ED Course  I have reviewed the triage vital signs and the nursing notes.  Pertinent labs & imaging results that were available during my care of the patient were reviewed by me and considered in my medical decision making (see chart for details).     Suzanne [name] is a 37 year old female history of depression, anxiety who presents to the ED with police after suicide attempt.  Patient with mild tachycardia but otherwise normal  vitals.  Patient recently treated for anxiety, depression but has been noncompliant with her medication.  Has multiple stressors at home.  Currently got kicked out of home after fighting with her grandma.  Patient was found by police pacing on a bridge.  When they were talking with her she attempted to jump off of the bridge but they were able to hold her back.  She is depressed and anxious upon arrival.  She is denying suicidal ideation but she did admit to try to jump off the bridge.  Patient physically feels well.  Lab work was obtained for medical clearance that was overall unremarkable.  Psychiatry evaluated the patient and recommend inpatient treatment.  Home medications were reordered and patient was awaiting final psychiatric placement.  Given ativan for anxiety. IVC filled.  This chart was dictated using voice recognition software.  Despite best efforts to proofread,  errors can occur which can change the documentation meaning.   Final Clinical Impressions(s) / ED Diagnoses   Final diagnoses:  Suicidal ideation    ED Discharge Orders    None       Virgina Norfolk, DO 03/26/18 1550

## 2018-03-26 NOTE — Progress Notes (Signed)
03/26/18  1656 Called police [phone] to [facility].

## 2018-03-26 NOTE — Progress Notes (Signed)
03/26/18  1602  Report given to Harrison MonsValeria [phone]. Patient to transport at 1730.

## 2018-03-26 NOTE — Progress Notes (Signed)
[facility].  Service of Dr. Jama Flavorsobos, MD.  Please call report to [phone].  RN will call when bed available. Rosey BathKelly Evalene Vath, RN

## 2018-03-26 NOTE — Progress Notes (Signed)
[facility] is currently reviewing patient. Patient's IVC is in progress, petitioner EDP.  Patient's [facility] assessment complete, ED provider note still pending. [facility] will follow up with CSW regarding acceptance.   This patient is recommended for inpatient psychiatric hospitalization. CSW faxed information to the following facilities:   [facility]  Azai Gaffin, [place]CSW-A Clinical Social Worker [phone]

## 2018-03-26 NOTE — ED Notes (Signed)
TTS at bedside will finish triage when done

## 2018-03-26 NOTE — ED Triage Notes (Signed)
Pt to ED with c/o of suicidal ideation with plan. Pt was brought in by [employer] where they were called out to suspicious individuals by a bridge. When [employer] arrived Pt tried to jump off the bridge.

## 2018-03-26 NOTE — [facility] Group Notes (Signed)
Pt attended and participated in wrap up group this evening. Pt stayed in their room instead.  

## 2018-03-26 NOTE — Progress Notes (Signed)
Pt admitted to the adult unit for attempted suicide. Pt presents with a flat affect and depressed mood. Pt noted to be minimal, guarded and cautious during the admission process. Pt vaguely expressed that she was standing too close to a railing and someone reported that she was going to jump off a bridge. Pt denies any suicidal thoughts. Pt reported that she's been having mood swing and acting dramatic since she started weaning herself off of Celexa. Pt stated that she's been on Celexa for 4-5 years for anxiety and depression. Pt reported doing well on Celexa, and most recently her doctor advised her that if she wanted to stop taking Celexa,she could slowly decrease her medication. Pt reported that she's been off her medications for 1 week. Pt expressed that she started decreasing her Celexa right before Thanksgiving. Pt denies AVH. Pt denies paranoia. Pt denies any trouble sleeping at bedtime. Pt denies any previous psych hospitalizations . Pt denies using any illegal drug, tobacco, and etoh.   Pt v/s assessed, belongings searched, skin assessed and required documents reviewed with pt and signed.

## 2018-03-26 NOTE — BH Assessment (Signed)
Assessment Note  Suzanne [name] is a 37 y.o. female who presented to [facility] on voluntary basis (transported to hospital by police) with complete of anxiety and depressive symptoms.  Pt was transported to hospital by police after they observed her walking by a bridge and looking over the edge.  Pt was last assessed by TTS on 11/29 and 03/25/18.  At that time, Pt presented to ED under IVC following an altercation with her family.  As of this writing, Pt is now under IVC (petitioner is EDP) as she is trying to leave.    Pt reported today that she has felt depressed and anxious over the last few weeks because she ran out of her Celexa and cannot afford to refill it.  Pt stated also that she is unemployed and ''in between'' residences.  Pt denied suicidal ideation, homicidal ideation, hallucination, and self-injurious behavior.  Pt endorsed despondency, insomnia, and anxiety.  When asked about the bridge, Pt said that she was looking over the side of a bridge, but denied any suicidal ideation or intent.  Pt stated also that she wanted to start on Celexa and be discharged.    TTS spoke with Pt's mother Suzanne [name] on 03/25/18 -- Per report, Mother reports she has not heard from her daughter in over one week and when mother drove down from [place] for a scheduled visit (Thanksgiving) daughter was found at her apartment presenting with a very bizarre affect and was observed to be very paranoid asking mother to turn off all her electronics due to patient reporting her phone calls were being monitored. Mother also reported all the electrical appliances in the residence were unplugged. Mother reported patient look disheveled and observed notable weight loss since she last saw the patient a month ago. Mother reports patient had put some of her electronic devices out in her yard. When mother questioned patient mother reported patient became upset and went to the neighbors residence who contacted EMS after patient started  voicing her paranoia to neighbors.           During assessment, Pt was alert and oriented.  She had fair eye contact and was calm.  Pt was dressed in scrubs, and she appeared appropriately groomed.  Mood was reported as anxious.  Affect was blunted.  Pt seemed guarded and may have minimized.  Pt endorsed despondency, insomnia, and anxiety.  She denied SI, HI, self-harm, and substance use concerns.  She also denied hallucination.  Speech was normal in rate, rhythm, and volume.  Thought processes were within normal range, and thought content was goal-oriented.  As indicated above, Pt may be experiencing delusion.  Pt's insight and judgment were poor.  Impulse control was fair.  Memory and concentration were fair.    Consulted with Molli Knock, NP, who determined that Pt meets inpatient criteria.  Diagnosis: Major Depressive Disorder with psychotic features  Past Medical History:  Past Medical History:  Diagnosis Date  . Anxiety   . Fatty liver   . Peptic ulcer     No past surgical history on file.  Family History: No family history on file.  Social History:  reports that she has never smoked. She has never used smokeless tobacco. She reports that she drank alcohol. She reports that she does not use drugs.  Additional Social History:  Alcohol / Drug Use Pain Medications: See MAR Prescriptions: See MAR Over the Counter: See MAR History of alcohol / drug use?: No history of alcohol / drug abuse  CIWA: CIWA-Ar BP: 111/77 Pulse Rate: (!) 120 COWS:    Allergies: No Known Allergies  Home Medications:  (Not in a hospital admission)  OB/GYN Status:  No LMP recorded.  General Assessment Data Location of Assessment: [facility] ED TTS Assessment: In system Is this a Tele or Face-to-Face Assessment?: Face-to-Face Is this an Initial Assessment or a Re-assessment for this encounter?: Initial Assessment Patient Accompanied by:: N/A Language Other than English: No Living Arrangements:  Homeless/Shelter What gender do you identify as?: Female Marital status: Single Maiden name: [place] Pregnancy Status: Unknown Living Arrangements: Other (Comment)(''I'm in-between residences'') Can pt return to current living arrangement?: Yes Admission Status: Voluntary Petitioner: (Pt transported by police) Referral Source: Other(police) Insurance type: Self pay     Crisis Care Plan Living Arrangements: Other (Comment)(''I'm in-between residences'') Name of Psychiatrist: None Name of Therapist: Pt said she could not remember  Education Status Is patient currently in school?: No Is the patient employed, unemployed or receiving disability?: Unemployed  Risk to self with the past 6 months Suicidal Ideation: No Has patient been a risk to self within the past 6 months prior to admission? : No Suicidal Intent: No Has patient had any suicidal intent within the past 6 months prior to admission? : No Is patient at risk for suicide?: No Suicidal Plan?: No Has patient had any suicidal plan within the past 6 months prior to admission? : No Access to Means: No What has been your use of drugs/alcohol within the last 12 months?: Pt denied(UDS/BAC not available at time of assessment) Previous Attempts/Gestures: No Other Self Harm Risks: Homeless Intentional Self Injurious Behavior: None Family Suicide History: Unknown Recent stressful life event(s): Conflict (Comment)(Altercation with family on Thanksgiving Day) Persecutory voices/beliefs?: No Depression: Yes Depression Symptoms: Insomnia, Isolating, Feeling angry/irritable, Feeling worthless/self pity(Anxious) Substance abuse history and/or treatment for substance abuse?: No Suicide prevention information given to non-admitted patients: Not applicable  Risk to Others within the past 6 months Homicidal Ideation: No Does patient have any lifetime risk of violence toward others beyond the six months prior to admission? : No Thoughts of  Harm to Others: No Current Homicidal Intent: No Current Homicidal Plan: No Access to Homicidal Means: No History of harm to others?: No Assessment of Violence: None Noted Does patient have access to weapons?: No Criminal Charges Pending?: No Does patient have a court date: No Is patient on probation?: No  Psychosis Hallucinations: None noted  Mental Status Report Appearance/Hygiene: In scrubs Eye Contact: Good Motor Activity: Unremarkable Speech: Logical/coherent Level of Consciousness: Quiet/awake Mood: Preoccupied Affect: Blunted Anxiety Level: Moderate Thought Processes: Coherent, Relevant Judgement: Impaired Orientation: Person, Place, Time, Situation Obsessive Compulsive Thoughts/Behaviors: None  Cognitive Functioning Concentration: Fair Memory: Recent Impaired, Remote Intact Is patient IDD: No Insight: Poor Impulse Control: Fair Appetite: Poor Have you had any weight changes? : Loss Amount of the weight change? (lbs): (Pt not sure) Sleep: No Change Total Hours of Sleep: 8(but disturbed sleep) Vegetative Symptoms: None  ADLScreening [facility] Assessment Services) Patient's cognitive ability adequate to safely complete daily activities?: Yes Patient able to express need for assistance with ADLs?: Yes Independently performs ADLs?: Yes (appropriate for developmental age)  Prior Inpatient Therapy Prior Inpatient Therapy: No  Prior Outpatient Therapy Prior Outpatient Therapy: Yes Prior Therapy Dates: 2019 Prior Therapy Facilty/Provider(s): cannot recall Reason for Treatment: depression Does patient have an ACCT team?: No Does patient have Intensive In-House Services?  : No Does patient have [facility] services? : No Does patient have P4CC services?: No  ADL Screening (condition at time of admission) Patient's cognitive ability adequate to safely complete daily activities?: Yes Is the patient deaf or have difficulty hearing?: No Does the patient have difficulty  seeing, even when wearing glasses/contacts?: No Does the patient have difficulty concentrating, remembering, or making decisions?: No Patient able to express need for assistance with ADLs?: Yes Does the patient have difficulty dressing or bathing?: No Independently performs ADLs?: Yes (appropriate for developmental age) Does the patient have difficulty walking or climbing stairs?: No Weakness of Legs: None Weakness of Arms/Hands: None  Home Assistive Devices/Equipment Home Assistive Devices/Equipment: None  Therapy Consults (therapy consults require a physician order) PT Evaluation Needed: No OT Evalulation Needed: No SLP Evaluation Needed: No Abuse/Neglect Assessment (Assessment to be complete while patient is alone) Abuse/Neglect Assessment Can Be Completed: Yes Physical Abuse: Denies Verbal Abuse: Denies Sexual Abuse: Denies Exploitation of patient/patient's resources: Denies Self-Neglect: Denies   Consults Spiritual Care Consult Needed: No Social Work Consult Needed: No Merchant navy officerAdvance Directives (For Healthcare) Does Patient Have a Medical Advance Directive?: No Would patient like information on creating a medical advance directive?: No - Patient declined          Disposition:  Disposition Initial Assessment Completed for this Encounter: Yes Disposition of Patient: Admit Type of inpatient treatment program: (Per Molli KnockJ. Lord, NP, Pt meets inpt criteria)  On Site Evaluation by:   Reviewed with Physician:    Dorris FetchEugene T Shantia Sanford 03/26/2018 2:01 PM

## 2018-03-26 NOTE — Progress Notes (Signed)
03/26/18  1654  Patient still has not given an urine sample.

## 2018-03-26 NOTE — Progress Notes (Signed)
D: Pt denies SI/HI/AVH. Pt is pleasant and cooperative. Pt very paranoid and guarded with her interaction. Pt in denial about her situation. Pt stated all she needed was to exercise and she would be ok and she would not have to take medications anymore. Pt was educated on that process , but pt was not receptive to the answers given to her at this time.  A: Pt was offered support and encouragement. Pt was given scheduled medications. Pt was encourage to attend groups. Q 15 minute checks were done for safety.   R:Pt attends groups and interacts well with peers and staff.  safety maintained on unit.   Problem: Activity: Goal: Interest or engagement in activities will improve Outcome: Not Progressing   Problem: Safety: Goal: Periods of time without injury will increase Outcome: Progressing

## 2018-03-27 DIAGNOSIS — F333 Major depressive disorder, recurrent, severe with psychotic symptoms: Secondary | ICD-10-CM

## 2018-03-27 LAB — TSH: TSH: 3.228 u[IU]/mL (ref 0.350–4.500)

## 2018-03-27 MED ORDER — POTASSIUM CHLORIDE CRYS ER 20 MEQ PO TBCR
20.0000 meq | EXTENDED_RELEASE_TABLET | Freq: Two times a day (BID) | ORAL | Status: AC
  Administered 2018-03-28: 20 meq via ORAL
  Filled 2018-03-27 (×3): qty 1

## 2018-03-27 MED ORDER — LORAZEPAM 1 MG PO TABS: 1.0000 mg | ORAL_TABLET | ORAL | Status: DC | PRN

## 2018-03-27 MED ORDER — ZIPRASIDONE MESYLATE 20 MG IM SOLR
20.0000 mg | INTRAMUSCULAR | Status: AC | PRN
  Administered 2018-03-31: 20 mg via INTRAMUSCULAR
  Filled 2018-03-27: qty 20

## 2018-03-27 MED ORDER — OLANZAPINE 5 MG PO TABS
5.0000 mg | ORAL_TABLET | Freq: Every day | ORAL | Status: DC
  Filled 2018-03-27 (×2): qty 1

## 2018-03-27 MED ORDER — OLANZAPINE 5 MG PO TBDP: 5.0000 mg | ORAL_TABLET | Freq: Three times a day (TID) | ORAL | Status: DC | PRN

## 2018-03-27 MED ORDER — CITALOPRAM HYDROBROMIDE 20 MG PO TABS
20.0000 mg | ORAL_TABLET | Freq: Every day | ORAL | Status: DC
  Administered 2018-03-28: 20 mg via ORAL
  Filled 2018-03-27 (×5): qty 1

## 2018-03-27 NOTE — Progress Notes (Signed)
Recreation Therapy Notes  INPATIENT RECREATION THERAPY ASSESSMENT  Patient Details Name: Suzanne [name] MRN: [id number] DOB: [birth year] Today's Date: 03/27/2018       Information Obtained From: Patient  Able to Participate in Assessment/Interview: Yes  Patient Presentation: Alert  Reason for Admission (Per Patient): Other (Comments)(Pt stated someone saw her standing to close to a bridge and called the police and they brought her here.)  Patient Stressors: Work, Other (Comment)(Bills)  Coping Skills:   Journal, Sports, Exercise, Meditate, Deep Breathing, Talk, Art, Prayer, Avoidance, Read, Dance, Hot Bath/Shower  Leisure Interests (2+):  Exercise - Walking, Individual - Reading, Sports - Exercise (Comment)(Yoga)  Frequency of Recreation/Participation: Other (Comment)(Daily)  Awareness of Community Resources:  Yes  Community Resources:  Park, Engineering geologistLibrary, Research scientist (physical sciences)Movie Theaters, Other (Comment)(Museums; Boutiques)  Current Use: Yes  If no, Barriers?:    Expressed Interest in State Street CorporationCommunity Resource Information: No  Enbridge EnergyCounty of Residence:  [place]  Patient Main Form of Transportation: Set designerCar  Patient Strengths:  Determined, Kind, Passionate  Patient Identified Areas of Improvement:  Yoga skills, Meditation, Connect more with nature/peace  Patient Goal for Hospitalization:  "To be released"  Current SI (including self-harm):  No  Current HI:  No  Current AVH: No  Staff Intervention Plan: Group Attendance, Collaborate with Interdisciplinary Treatment Team  Consent to Intern Participation: N/A    Caroll RancherMarjette Cearra Portnoy, LRT/CTRS  Caroll RancherLindsay, Makenize Messman A 03/27/2018, 1:52 PM

## 2018-03-27 NOTE — Progress Notes (Addendum)
Nursing Progress Note: 7p-7a D: Pt currently presents with a labile/preoccupied affect and behavior. Pt states "I do not want a man coming in and doing checks. That is not appropriate, It makes me uncomfortable. I feel good I Don't need meds." Interacting appropriately with the milieu. Pt reports good sleep during the previous night with current medication regimen. Pt did attend wrap-up group.  A: Pt refused all medications per providers orders. Pt's labs and vitals were monitored throughout the night. Pt supported emotionally and encouraged to express concerns and questions. Pt educated on medications.  R: Pt's safety ensured with 15 minute and environmental checks. Pt currently denies SI, HI, and AVH. Pt verbally contracts to seek staff if SI,HI, or AVH occurs and to consult with staff before acting on any harmful thoughts. Will continue to monitor.

## 2018-03-27 NOTE — Progress Notes (Signed)
Recreation Therapy Notes  Date: 12.2.19 Time: 0930 Location: [address] Dayroom  Group Topic: Stress Management  Goal Area(s) Addresses:  Patient will verbalize importance of using healthy stress management.  Patient will identify positive emotions associated with healthy stress management.   Intervention: Stress Management  Activity :  Meditation.  LRT introduced the stress management technique of meditation.  LRT played a meditation dealing with impermanence.  Patients were to listen and follow along as the meditation played to engage in the technique.  Education:  Stress Management, Discharge Planning.   Education Outcome: Acknowledges edcuation/In group clarification offered/Needs additional education  Clinical Observations/Feedback: Pt did not attend group.    Clide Remmers, LRT/CTRS         Kentarius Partington A 03/27/2018 11:23 AM 

## 2018-03-27 NOTE — [facility] Group Notes (Signed)
[facility] LCSW Group Therapy Note  Date/Time: 03/27/18, 1315  Type of Therapy and Topic:  Group Therapy:  Overcoming Obstacles  Participation Level:  Did not attend  Description of Group:    In this group patients will be encouraged to explore what they see as obstacles to their own wellness and recovery. They will be guided to discuss their thoughts, feelings, and behaviors related to these obstacles. The group will process together ways to cope with barriers, with attention given to specific choices patients can make. Each patient will be challenged to identify changes they are motivated to make in order to overcome their obstacles. This group will be process-oriented, with patients participating in exploration of their own experiences as well as giving and receiving support and challenge from other group members.  Therapeutic Goals: 1. Patient will identify personal and current obstacles as they relate to admission. 2. Patient will identify barriers that currently interfere with their wellness or overcoming obstacles.  3. Patient will identify feelings, thought process and behaviors related to these barriers. 4. Patient will identify two changes they are willing to make to overcome these obstacles:    Summary of Patient Progress      Therapeutic Modalities:   Cognitive Behavioral Therapy Solution Focused Therapy Motivational Interviewing Relapse Prevention Therapy  Greg Hawley Pavia, LCSW 

## 2018-03-27 NOTE — Progress Notes (Signed)
Patient refused EKG and refused to provide a urine sample again.

## 2018-03-27 NOTE — Progress Notes (Signed)
Patient has refused EKG several times today, as well as refused to fill a urine sample. Writer tried to convince patient to take medications as well, but patient refused. Patient said "I'm just adjusting to being here. I need some time." Patient agreed to take medications before bed.
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
## 2018-03-27 NOTE — [facility] Suicide Risk Assessment (Signed)
[facility] INPATIENT:  Family/Significant Other Suicide Prevention Education  Suicide Prevention Education:  Patient Refusal for Family/Significant Other Suicide Prevention Education: The patient Suzanne [name] has refused to provide written consent for family/significant other to be provided Family/Significant Other Suicide Prevention Education during admission and/or prior to discharge.  Physician notified.  SPE completed with patient, as patient refused to consent to family contact. Patient was encouraged to share information with support network, ask questions, and talk about any concerns relating to SPE. Patient denies access to guns/firearms and verbalized understanding of information provided. Mobile Crisis information also provided to patient.    Maeola SarahJolan E Karsten Howry 03/27/2018, 11:43 AM
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
## 2018-03-27 NOTE — [facility] Group Notes (Signed)
Pt was invited but did not attend orientation/goals group. 

## 2018-03-27 NOTE — Plan of Care (Addendum)
Patient was resting in bed with eyes closed upon approach. Patient has not gotten up yet today. Denies SI HI AVH. Denies any physical pain. Patient voiced no complaints. Safety is maintained with 15 minute checks as well as environmental checks. Will continue to monitor.  Problem: Education: Goal: Emotional status will improve Outcome: Progressing Goal: Mental status will improve Outcome: Progressing   Problem: Activity: Goal: Sleeping patterns will improve Outcome: Progressing   Problem: Safety: Goal: Periods of time without injury will increase Outcome: Progressing   Patient remains safe on the unit.
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
## 2018-03-27 NOTE — Progress Notes (Signed)
Patient was moved from 400 hall to Room 501.  Patient stated she would like to talk to MD.

## 2018-03-27 NOTE — Tx Team (Signed)
Interdisciplinary Treatment and Diagnostic Plan Update  03/27/2018 Time of Session: 9:00am Suzanne [name] MRN: [id number]  Principal Diagnosis:  MDD (major depressive disorder), recurrent, severe, with psychosis ([place])  Secondary Diagnoses: Active Problems:   MDD (major depressive disorder), recurrent, severe, with psychosis ([place])   Current Medications:  Current Facility-Administered Medications  Medication Dose Route Frequency Provider Last Rate Last Dose  . acetaminophen (TYLENOL) tablet 650 mg  650 mg Oral Q6H PRN Derrill Center, NP      . alum & mag hydroxide-simeth (MAALOX/MYLANTA) 200-200-20 MG/5ML suspension 30 mL  30 mL Oral Q4H PRN Derrill Center, NP      . magnesium hydroxide (MILK OF MAGNESIA) suspension 30 mL  30 mL Oral Daily PRN Derrill Center, NP      . traZODone (DESYREL) tablet 50 mg  50 mg Oral QHS PRN Derrill Center, NP       PTA Medications: Medications Prior to Admission  Medication Sig Dispense Refill Last Dose  . cephALEXin (KEFLEX) 500 MG capsule Take 1 capsule (500 mg total) by mouth every 8 (eight) hours. 30 capsule 0   . citalopram (CELEXA) 20 MG tablet Take 20 mg by mouth daily.   Past Month at Unknown time    Patient Stressors: Marital or family conflict Medication change or noncompliance  Patient Strengths: Ability for insight Capable of independent living  Treatment Modalities: Medication Management, Group therapy, Case management,  1 to 1 session with clinician, Psychoeducation, Recreational therapy.   Physician Treatment Plan for Primary Diagnosis: <principal problem not specified> Long Term Goal(s):     Short Term Goals:    Medication Management: Evaluate patient's response, side effects, and tolerance of medication regimen.  Therapeutic Interventions: 1 to 1 sessions, Unit Group sessions and Medication administration.  Evaluation of Outcomes: Not Met  Physician Treatment Plan for Secondary Diagnosis: Active Problems:   MDD (major  depressive disorder), recurrent, severe, with psychosis ([place])  Long Term Goal(s):     Short Term Goals:       Medication Management: Evaluate patient's response, side effects, and tolerance of medication regimen.  Therapeutic Interventions: 1 to 1 sessions, Unit Group sessions and Medication administration.  Evaluation of Outcomes: Not Met   RN Treatment Plan for Primary Diagnosis: <principal problem not specified> Long Term Goal(s): Knowledge of disease and therapeutic regimen to maintain health will improve  Short Term Goals: Ability to remain free from injury will improve, Ability to verbalize frustration and anger appropriately will improve, Ability to demonstrate self-control, Ability to verbalize feelings will improve, Ability to disclose and discuss suicidal ideas and Compliance with prescribed medications will improve  Medication Management: RN will administer medications as ordered by provider, will assess and evaluate patient's response and provide education to patient for prescribed medication. RN will report any adverse and/or side effects to prescribing provider.  Therapeutic Interventions: 1 on 1 counseling sessions, Psychoeducation, Medication administration, Evaluate responses to treatment, Monitor vital signs and CBGs as ordered, Perform/monitor CIWA, COWS, AIMS and Fall Risk screenings as ordered, Perform wound care treatments as ordered.  Evaluation of Outcomes: Not Met   LCSW Treatment Plan for Primary Diagnosis:  MDD (major depressive disorder), recurrent, severe, with psychosis ([place]) Long Term Goal(s): Safe transition to appropriate next level of care at discharge, Engage patient in therapeutic group addressing interpersonal concerns.  Short Term Goals: Engage patient in aftercare planning with referrals and resources, Increase social support, Increase ability to appropriately verbalize feelings, Increase emotional regulation and Increase skills for wellness  and  recovery  Therapeutic Interventions: Assess for all discharge needs, 1 to 1 time with Education officer, museum, Explore available resources and support systems, Assess for adequacy in community support network, Educate family and significant other(s) on suicide prevention, Complete Psychosocial Assessment, Interpersonal group therapy.  Evaluation of Outcomes: Not Met   Progress in Treatment: Attending groups: No. Participating in groups: No. Taking medication as prescribed: Yes. Toleration medication: Yes. Family/Significant other contact made: No, will contact:  CSW to contact supports if patient consents Patient understands diagnosis: Yes. Discussing patient identified problems/goals with staff: Yes. Medical problems stabilized or resolved: No. Denies suicidal/homicidal ideation: No. Issues/concerns per patient self-inventory: No.  New problem(s) identified: No, Describe:  CSW continuing to assess  New Short Term/Long Term Goal(s): medication management for mood stabilization; elimination of SI thoughts; development of comprehensive mental wellness/sobriety plan.  Patient Goals "To be released."  Discharge Plan or Barriers: CSW continuing to assess. Patient will be discharged with appropriate follow up and community resources.  Reason for Continuation of Hospitalization: Aggression Anxiety Delusions  Depression Medication stabilization Suicidal ideation  Estimated Length of Stay: 3-5 days  Attendees: Patient: Suzanne [name] 03/27/2018 9:27 AM  Physician: Larene Beach 03/27/2018 9:27 AM  Nursing: Yetta Flock 03/27/2018 9:27 AM  RN Care Manager: 03/27/2018 9:27 AM  Social Worker: Stephanie Acre, [place] 03/27/2018 9:27 AM  Recreational Therapist:  03/27/2018 9:27 AM  Other:  03/27/2018 9:27 AM  Other:  03/27/2018 9:27 AM  Other: 03/27/2018 9:27 AM    Scribe for Treatment Team: Joellen Jersey, [place] 03/27/2018 9:27 AM

## 2018-03-28 MED ORDER — RISPERIDONE 3 MG PO TABS
3.0000 mg | ORAL_TABLET | Freq: Every day | ORAL | Status: DC
  Filled 2018-03-28 (×2): qty 1
  Filled 2018-03-28 (×2): qty 7
  Filled 2018-03-28: qty 1
  Filled 2018-03-28 (×3): qty 7

## 2018-03-28 MED ORDER — PRENATAL PLUS 27-1 MG PO TABS
1.0000 | ORAL_TABLET | Freq: Every day | ORAL | Status: DC
  Administered 2018-03-28 – 2018-04-04 (×7): 1 via ORAL
  Filled 2018-03-28 (×2): qty 7
  Filled 2018-03-28 (×3): qty 1
  Filled 2018-03-28 (×3): qty 7

## 2018-03-28 NOTE — Progress Notes (Signed)
Recreation Therapy Notes  Date: 12.3.19 Time: 1000 Location: 500 Hall Dayroom  Group Topic: Self-Esteem  Goal Area(s) Addresses:  Patient will successfully identify positive attributes about themselves.  Patient will successfully identify benefit of improved self-esteem.   Behavioral Response: Minimal  Intervention: Paper, colored pencils  Activity: Personalized Plates.  Patients were to create a personalized license plate the highlighted positive characteristics, important dates, things they like or just things that set them apart from everyone else.   Education:  Self-Esteem, Building control surveyorDischarge Planning.   Education Outcome: Acknowledges education/In group clarification offered/Needs additional education  Clinical Observations/Feedback: Pt was quiet and flat. Pt finished her plate rather quickly.  Pt left and came back a few times before leaving and not returning.    Caroll RancherMarjette Marayah Higdon, LRT/CTRS      Lillia AbedLindsay, Ahmani Prehn A 03/28/2018 11:15 AM

## 2018-03-28 NOTE — Progress Notes (Signed)
LCSW Group Therapy Note 03/28/2018 11:32 AM  Type of Therapy and Topic: Group Therapy: DBT House  Participation Level: None   Description of Group:  In this group patients will be encouraged to explore  their values, behaviors they want to change, emotions they wish to increase, protective factors, supports, coping skills, and motivational factors. They will be guided to discuss their thoughts, feelings, and behaviors related to these obstacles. The group will be asked to individually process the activity and share their insights with the group. This group will be process-oriented, with patients participating in exploration of their own experiences as well as giving and receiving support and challenge from other group members.   Therapeutic Goals: 1. Patient will identify their values 2. Patient will identify behaviors they wish to modify  3. Patient will identify feelings and emotions they wish to increase 4. Patient will identify strengths, supports, protective factors 5. Patient will identify coping skills 6. Patient will identify goals and motivating factors for change   Summary of Patient Progress Patient briefly entered room during group and left shortly afterward.  Therapeutic Modalities:  Dialectical Behavioral Therapy  Motivational Interviewing Relapse Prevention Therapy

## 2018-03-28 NOTE — Progress Notes (Signed)
[facility] MD Progress Note  03/28/2018 9:01 AM Suzanne [name]  MRN:  [id number] Subjective:   Patient in room husband meds complaining of telepathy possibly thought insertion believes people are telling her what today will get anxious and having hallucinations possibly Principal Problem: Anxiety depression possible psychosis Diagnosis: Active Problems:   MDD (major depressive disorder), recurrent, severe, with psychosis ([facility])  Total Time spent with patient: 20 minutes  Past Medical History:  Past Medical History:  Diagnosis Date  . Anxiety   . Fatty liver   . Peptic ulcer    History reviewed. No pertinent surgical history. Family History: History reviewed. No pertinent family history.  Social History:  Social History   Substance and Sexual Activity  Alcohol Use Not Currently  . Frequency: Never   Comment: Last use was March 2019     Social History   Substance and Sexual Activity  Drug Use Never    Social History   Socioeconomic History  . Marital status: Single    Spouse name: Not on file  . Number of children: Not on file  . Years of education: Not on file  . Highest education level: Not on file  Occupational History  . Occupation: Unemployed  Social Needs  . Financial resource strain: Not on file  . Food insecurity:    Worry: Not on file    Inability: Not on file  . Transportation needs:    Medical: Not on file    Non-medical: Not on file  Tobacco Use  . Smoking status: Never Smoker  . Smokeless tobacco: Never Used  Substance and Sexual Activity  . Alcohol use: Not Currently    Frequency: Never    Comment: Last use was March 2019  . Drug use: Never  . Sexual activity: Not Currently  Lifestyle  . Physical activity:    Days per week: Not on file    Minutes per session: Not on file  . Stress: Not on file  Relationships  . Social connections:    Talks on phone: Not on file    Gets together: Not on file    Attends religious service: Not on file    Active  member of club or organization: Not on file    Attends meetings of clubs or organizations: Not on file    Relationship status: Not on file  Other Topics Concern  . Not on file  Social History Narrative   Pt stated that she is unemployed and currently without a fixed address.  Pt stated that she recently ran out of her Celexa.   Additional Social History:                         Sleep: Good  Appetite:  Fair  Current Medications: Current Facility-Administered Medications  Medication Dose Route Frequency Provider Last Rate Last Dose  . acetaminophen (TYLENOL) tablet 650 mg  650 mg Oral Q6H PRN Oneta Rack, NP      . alum & mag hydroxide-simeth (MAALOX/MYLANTA) 200-200-20 MG/5ML suspension 30 mL  30 mL Oral Q4H PRN Oneta Rack, NP      . citalopram (CELEXA) tablet 20 mg  20 mg Oral Daily Cobos, Rockey Situ, MD   20 mg at 03/28/18 0801  . OLANZapine zydis (ZYPREXA) disintegrating tablet 5 mg  5 mg Oral Q8H PRN Cobos, Rockey Situ, MD       And  . LORazepam (ATIVAN) tablet 1 mg  1 mg Oral PRN Cobos,  Rockey SituFernando A, MD       And  . ziprasidone (GEODON) injection 20 mg  20 mg Intramuscular PRN Cobos, Rockey SituFernando A, MD      . magnesium hydroxide (MILK OF MAGNESIA) suspension 30 mL  30 mL Oral Daily PRN Oneta RackLewis, Tanika N, NP      . potassium chloride SA (K-DUR,KLOR-CON) CR tablet 20 mEq  20 mEq Oral BID Cobos, Rockey SituFernando A, MD   20 mEq at 03/28/18 0801  . prenatal multivitamin tablet 1 tablet  1 tablet Oral Q1200 Malvin JohnsFarah, Jonay Hitchcock, MD      . risperiDONE (RISPERDAL) tablet 3 mg  3 mg Oral QHS Malvin JohnsFarah, Adar Rase, MD        Lab Results:  Results for orders placed or performed during the hospital encounter of 03/26/18 (from the past 48 hour(s))  TSH     Status: None   Collection Time: 03/27/18  6:30 AM  Result Value Ref Range   TSH 3.228 0.350 - 4.500 uIU/mL    Comment: Performed by a 3rd Generation assay with a functional sensitivity of <=0.01 uIU/mL. Performed at [facility],  [address] [place], [place] [address]     Blood Alcohol level:  Lab Results  Component Value Date   ETH <10 03/26/2018   ETH <10 03/23/2018    Metabolic Disorder Labs: No results found for: HGBA1C, MPG No results found for: PROLACTIN No results found for: CHOL, TRIG, HDL, CHOLHDL, VLDL, LDLCALC  Physical Findings: AIMS: Facial and Oral Movements Muscles of Facial Expression: None, normal Lips and Perioral Area: None, normal Jaw: None, normal Tongue: None, normal,Extremity Movements Upper (arms, wrists, hands, fingers): None, normal Lower (legs, knees, ankles, toes): None, normal, Trunk Movements Neck, shoulders, hips: None, normal, Overall Severity Severity of abnormal movements (highest score from questions above): None, normal Incapacitation due to abnormal movements: None, normal Patient's awareness of abnormal movements (rate only patient's report): No Awareness, Dental Status Current problems with teeth and/or dentures?: No Does patient usually wear dentures?: No  CIWA:    COWS:     Musculoskeletal: Strength & Muscle Tone: within normal limits Gait & Station: normal Patient leans: N/A  Psychiatric Specialty Exam: Physical Exam  ROS  Blood pressure (!) 133/92, pulse (!) 104, temperature 98.4 F (36.9 C), temperature source Oral, resp. rate 18, height 5\' 1"  (1.549 m), weight 67.1 kg, last menstrual period 02/20/2018, SpO2 99 %.Body mass index is 27.96 kg/m.  General Appearance: Casual  Eye Contact:  Minimal  Speech:  Clear and Coherent  Volume:  Decreased  Mood:  Anxious and Depressed  Affect:  Restricted  Thought Process:  Coherent  Orientation:  Full (Time, Place, and Person)  Thought Content:  Delusions  Suicidal Thoughts:  No  Homicidal Thoughts:  No  Memory:  Immediate;   Fair  Judgement:  Fair  Insight:  Fair  Psychomotor Activity:  Decreased  Concentration:  Concentration: Poor  Recall:  FiservFair  Fund of Knowledge:  Fair  Language:  Good   Akathisia:  Negative  Handed:  Right  AIMS (if indicated):     Assets:  Communication Skills  ADL's:  Intact  Cognition:  WNL  Sleep:  Number of Hours: 1.5   Pt seems to have thought insertion called "telepathy- they tell me what to do"  Treatment Plan Summary: Daily contact with patient to assess and evaluate symptoms and progress in treatment and Medication management  Dashley Monts, MD 03/28/2018, 9:01 AM

## 2018-03-28 NOTE — Plan of Care (Signed)
Patient finally took medications after not taking any all day yesterday. Denies SI HI AVH. Denies physical pain. No questions or complaints. Patient safety is maintained by 15 minute checks as well as environmental checks. Will continue to monitor and provide support.  Problem: Activity: Goal: Sleeping patterns will improve Outcome: Progressing   Problem: Safety: Goal: Periods of time without injury will increase Outcome: Progressing   Problem: Education: Goal: Emotional status will improve Outcome: Not Progressing   Problem: Coping: Goal: Ability to verbalize frustrations and anger appropriately will improve Outcome: Not Progressing

## 2018-03-28 NOTE — Progress Notes (Signed)
D: Pt denies SI/HI/AVH. Pt is paranoid, pt refusing medication. Pt on unit paranoid about everything.  A: Pt was offered support and encouragement. . Pt was encourage to attend groups. Q 15 minute checks were done for safety.  R: safety maintained on unit.  Problem: Education: Goal: Emotional status will improve Outcome: Not Progressing   Problem: Education: Goal: Mental status will improve Outcome: Not Progressing   Problem: Education: Goal: Verbalization of understanding the information provided will improve Outcome: Not Progressing   Problem: Activity: Goal: Interest or engagement in activities will improve Outcome: Not Progressing   Problem: Coping: Goal: Ability to verbalize frustrations and anger appropriately will improve Outcome: Not Progressing

## 2018-03-28 NOTE — Progress Notes (Signed)
Patient is still refusing to provide a urine sample and EKG.

## 2018-03-29 MED ORDER — CITALOPRAM HYDROBROMIDE 10 MG PO TABS
10.0000 mg | ORAL_TABLET | Freq: Two times a day (BID) | ORAL | Status: DC
  Administered 2018-04-03 – 2018-04-04 (×2): 10 mg via ORAL
  Filled 2018-03-29 (×2): qty 7
  Filled 2018-03-29: qty 1
  Filled 2018-03-29: qty 7
  Filled 2018-03-29: qty 1
  Filled 2018-03-29 (×8): qty 7
  Filled 2018-03-29 (×2): qty 1
  Filled 2018-03-29: qty 7

## 2018-03-29 MED ORDER — CLONAZEPAM 0.5 MG PO TABS
0.2500 mg | ORAL_TABLET | Freq: Three times a day (TID) | ORAL | Status: AC
  Filled 2018-03-29: qty 1

## 2018-03-29 NOTE — Progress Notes (Addendum)
Patient has been asked throughout the day for a UA sample and she says "I know".  No UA received for lab pick up.  Lab told nurse that patient refused to have labs drawn tonight.  Lab will remove lab orders.  New orders will need to be re-entered if labs are to be drawn later.

## 2018-03-29 NOTE — Progress Notes (Addendum)
D:  Patient's self inventory sheet, patient sleeps good, no sleep medication.  Good appetite, low energy level, good concentration.  Denied depression, hopeless and anxiety.  Denied withdrawals.  Denied SI.  Denied physical pain.  Goal is to do yoga later today.  Plans to attend groups.  Plans to discharge home. A:  Patient refused medications celexa and klonipin, did take vitamin.  Emotional support and encouragement given patient. R:  Safety maintained with 15 minute checks..  Patient has been seen standing in hallway, starring at the floor for periods of time.  Patient has been seen covering her ear with her hand several different times.  While talking to nurse, the patient denied SI and HI, contracts for safety.  Denied A/V hallucinations.  Safety maintained with 15 minute checks. Patient refused 1200 vital signs.   UA container has been given patient but no UA obtained.  Patient stated she will do it "later".

## 2018-03-29 NOTE — Progress Notes (Signed)
[facility]  03/29/2018 10:29 AM Suzanne [name]  MRN:  [id number] Subjective:   Suzanne [name] reports that she has a little bit of an upset stomach and she is has no take Celexa.  She continues to report some degree of possible thought insertion but she is backing away from this today when we tried to go into detail.  She remains anxious and depressed and we discussed in team whether she has a depression with psychosis versus a primary psychotic disorder.  At any rate she states she is little better on current medication And cognitive therapy focused on getting better and keeping her thoughts positive she denies wanting to harm self today denies current auditory visual hallucinations Principal Problem: Depression/more vague as far psychotic symptoms today Diagnosis: Active Problems:   MDD (major depressive disorder), recurrent, severe, with psychosis ([facility])  Total Time spent with patient: 20 minutes  Past Medical History:  Past Medical History:  Diagnosis Date  . Anxiety   . Fatty liver   . Peptic ulcer    History reviewed. No pertinent surgical history. Family History: History reviewed. No pertinent family history. Social History:  Social History   Substance and Sexual Activity  Alcohol Use Not Currently  . Frequency: Never   Comment: Last use was March 2019     Social History   Substance and Sexual Activity  Drug Use Never    Social History   Socioeconomic History  . Marital status: Single    Spouse name: Not on file  . Number of children: Not on file  . Years of education: Not on file  . Highest education level: Not on file  Occupational History  . Occupation: Unemployed  Social Needs  . Financial resource strain: Not on file  . Food insecurity:    Worry: Not on file    Inability: Not on file  . Transportation needs:    Medical: Not on file    Non-medical: Not on file  Tobacco Use  . Smoking status: Never Smoker  . Smokeless tobacco: Never Used  Substance and  Sexual Activity  . Alcohol use: Not Currently    Frequency: Never    Comment: Last use was March 2019  . Drug use: Never  . Sexual activity: Not Currently  Lifestyle  . Physical activity:    Days per week: Not on file    Minutes per session: Not on file  . Stress: Not on file  Relationships  . Social connections:    Talks on phone: Not on file    Gets together: Not on file    Attends religious service: Not on file    Active member of club or organization: Not on file    Attends meetings of clubs or organizations: Not on file    Relationship status: Not on file  Other Topics Concern  . Not on file  Social History Narrative   Pt stated that she is unemployed and currently without a fixed address.  Pt stated that she recently ran out of her Celexa.   Additional Social History:                         Sleep: Good  Appetite:  Good  Current Medications: Current Facility-Administered Medications  Medication Dose Route Frequency Provider Last Rate Last Dose  . acetaminophen (TYLENOL) tablet 650 mg  650 mg Oral Q6H PRN Oneta RackLewis, Tanika N, NP      . alum & mag hydroxide-simeth (  MAALOX/MYLANTA) 200-200-20 MG/5ML suspension 30 mL  30 mL Oral Q4H PRN Oneta Rack, NP      . citalopram (CELEXA) tablet 10 mg  10 mg Oral BID Malvin Johns, MD      . clonazePAM Scarlette [place]) tablet 0.25 mg  0.25 mg Oral TID Malvin Johns, MD      . OLANZapine zydis (ZYPREXA) disintegrating tablet 5 mg  5 mg Oral Q8H PRN Cobos, Rockey Situ, MD       And  . LORazepam (ATIVAN) tablet 1 mg  1 mg Oral PRN Cobos, Rockey Situ, MD       And  . ziprasidone (GEODON) injection 20 mg  20 mg Intramuscular PRN Cobos, Fernando A, MD      . magnesium hydroxide (MILK OF MAGNESIA) suspension 30 mL  30 mL Oral Daily PRN Oneta Rack, NP      . prenatal multivitamin tablet 1 tablet  1 tablet Oral Q1200 Malvin Johns, MD   1 tablet at 03/28/18 1159  . risperiDONE (RISPERDAL) tablet 3 mg  3 mg Oral QHS Malvin Johns, MD         Lab Results: No results found for this or any previous visit (from the past 48 hour(s)).  Blood Alcohol level:  Lab Results  Component Value Date   ETH <10 03/26/2018   ETH <10 03/23/2018    Metabolic Disorder Labs: No results found for: HGBA1C, MPG No results found for: PROLACTIN No results found for: CHOL, TRIG, HDL, CHOLHDL, VLDL, LDLCALC  Physical Findings: AIMS: Facial and Oral Movements Muscles of Facial Expression: None, normal Lips and Perioral Area: None, normal Jaw: None, normal Tongue: None, normal,Extremity Movements Upper (arms, wrists, hands, fingers): None, normal Lower (legs, knees, ankles, toes): None, normal, Trunk Movements Neck, shoulders, hips: None, normal, Overall Severity Severity of abnormal movements (highest score from questions above): None, normal Incapacitation due to abnormal movements: None, normal Patient's awareness of abnormal movements (rate only patient's report): No Awareness, Dental Status Current problems with teeth and/or dentures?: No Does patient usually wear dentures?: No  CIWA:    COWS:     Musculoskeletal: Strength & Muscle Tone: within normal limits Gait & Station: nl Psychiatric Specialty Exam: Physical Exam  ROS  Blood pressure (!) 133/92, pulse (!) 104, temperature 98.4 F (36.9 C), temperature source Oral, resp. rate 18, height 5\' 1"  (1.549 m), weight 67.1 kg, last menstrual period 02/20/2018, SpO2 99 %.Body mass index is 27.96 kg/m.  General Appearance: Casual  Eye Contact:  Poor  Speech:  Clear and Coherent  Volume:  Decreased  Mood:  Anxious  Affect:  Depressed  Thought Process:  Goal Directed  Orientation:  Full (Time, Place, and Person)  Thought Content:  Delusions as mentioned possibly thought insertion  Suicidal Thoughts:  No  Homicidal Thoughts:  No  Memory:  Immediate;   Good  Judgement:  Good  Insight:  Good  Psychomotor Activity:  Decreased  Concentration:  Concentration: Good  Recall:  Good   Fund of Knowledge:  Good  Language:  Good  Akathisia:  Negative  Handed:  Right  AIMS (if indicated):     Assets:  Communication Skills Desire for Improvement  ADL's:  Intact  Cognition:  WNL  Sleep:  Number of Hours: 4   Meds are discussed we will split the Celexa dose to 10 twice daily to reduce nausea continue Risperdal and continue vitamins as well as add a low-dose benzodiazepine due to observed anxiety as a temporary measure  Treatment Plan Summary: Daily contact with patient to assess and evaluate symptoms and progress in treatment  Travonne Schowalter, MD 03/29/2018, 10:29 AM

## 2018-03-29 NOTE — Progress Notes (Signed)
Patient has been asked several times for UA, and patient states each time, "I know".  Patient laying in bed resting.

## 2018-03-29 NOTE — Therapy (Signed)
Occupational Therapy Group Note  Date:  03/29/2018 Time:  11:42 AM  Group Topic/Focus:  Self Esteem Action Plan:   The focus of this group is to help patients create a plan to continue to build self-esteem after discharge.  Participation Level:  Active  Participation Quality:  Appropriate  Affect:  Flat  Cognitive:  Alert and Oriented  Insight: Limited  Engagement in Group:  Engaged  Modes of Intervention:  Activity, Discussion, Education and Socialization  Additional Comments:    S: "I am in a good mood today"  O: Education given on self esteem and positive thinking. Group discussion facilitated with negative and positive precipitators of self esteem and its relation to mental health. Pt encouraged to share personal experiences and relations with other group members. Positive journaling activity given to help pt highlight positive, and recognize and process the negative. Pt encouraged to share with others at end of group.  A: Pt presents to group after standing in same spot in hallway, staring off at floor. After x2 cues, pt willing to come to group, presenting pleasant but flat affect. When entering room, pt opening and closing door despite door usually being closed during group. Pt then contributing to discussion by sharing yoga and meditation helps increase her self esteem. She then specifically states that verbal abuse decreases her self esteem, because she can get so low to the point of believing the negative things said to her.   P: Handouts given to help facilitate carryover into community. OT groups will be x1 per week while pt inpatient.  Dalphine HandingKaylee Geryl Dohn, MSOT, OTR/L Behavioral Health OT/ Acute Relief OT PHP Office: [phone]  Dalphine HandingKaylee Gerilyn Stargell 03/29/2018, 11:42 AM

## 2018-03-29 NOTE — Progress Notes (Signed)
Recreation Therapy Notes  Date: 12.4.19 Time: 1000 Location: 500 Hall Dayroom  Group Topic: Communication, Team Building, Problem Solving  Goal Area(s) Addresses:  Patient will effectively work with peer towards shared goal.  Patient will identify skill used to make activity successful.  Patient will identify how skills used during activity can be used to reach post d/c goals.   Behavioral Response: None  Intervention: STEM Activity   Activity: In team's, using 20 small plastic cups, patients were asked to build the tallest free standing tower possible.    Education: Pharmacist, communityocial Skills, Building control surveyorDischarge Planning.   Education Outcome: Acknowledges education/In group clarification offered/Needs additional education.   Clinical Observations/Feedback: Pt did not want to participate.  Pt stated she would just watch.    Caroll RancherMarjette Jillana Selph, LRT/CTRS         Caroll RancherLindsay, Alie Moudy A 03/29/2018 11:49 AM

## 2018-03-29 NOTE — Progress Notes (Signed)
Adult Psychoeducational Group Note  Date:  03/29/2018 Time:  9:29 PM  Group Topic/Focus:  Wrap-Up Group:   The focus of this group is to help patients review their daily goal of treatment and discuss progress on daily workbooks.  Participation Level:  Active  Participation Quality:  Appropriate  Affect:  Appropriate  Cognitive:  Appropriate  Insight: Appropriate  Engagement in Group:  Engaged  Modes of Intervention:  Discussion  Additional Comments: The patient expressed that she rates today a 10.The patient also said that she  enjoyed group.  Octavio Mannshigpen, Cordelle Dahmen Lee 03/29/2018, 9:29 PM

## 2018-03-29 NOTE — Plan of Care (Signed)
Nurse discussed anxiety, depression, coping skills with patient. 

## 2018-03-30 MED ORDER — RISPERIDONE 1 MG PO TABS
1.0000 mg | ORAL_TABLET | Freq: Every day | ORAL | Status: DC
  Filled 2018-03-30 (×2): qty 7
  Filled 2018-03-30: qty 1

## 2018-03-30 MED ORDER — CITALOPRAM HYDROBROMIDE 10 MG PO TABS: 10.0000 mg | ORAL_TABLET | Freq: Two times a day (BID) | ORAL | 2 refills | Status: AC

## 2018-03-30 MED ORDER — CLONAZEPAM 0.5 MG PO TABS: 0.2500 mg | ORAL_TABLET | Freq: Three times a day (TID) | ORAL | 0 refills | Status: AC

## 2018-03-30 MED ORDER — PRENATAL MULTIVITAMIN CH: 1.0000 | ORAL_TABLET | Freq: Every day | ORAL | 2 refills | Status: AC

## 2018-03-30 MED ORDER — RISPERIDONE 3 MG PO TABS: 3.0000 mg | ORAL_TABLET | Freq: Every day | ORAL | 2 refills | Status: AC

## 2018-03-30 NOTE — Progress Notes (Signed)
Patient stated she continues to refuse her medications [facility] that's it.

## 2018-03-30 NOTE — Plan of Care (Signed)
  Problem: Medication: Goal: Compliance with prescribed medication regimen will improve Outcome: Not Progressing Note:  She continues to refuse psychiatric medication.  We will continue to encourage her to take her medications.

## 2018-03-30 NOTE — Progress Notes (Signed)
Patient refuses to take her klonipin at 1700.  Patient has been asked repeatedly throughout the day for UA sample and patient states she will do it later in the day.

## 2018-03-30 NOTE — Progress Notes (Signed)
D:  Suzanne [name] was in her room much of the evening.  She did attend evening wrap up group.  She reported her day was good and that "I am just winding down for the night."  She refused to take her hs medications stating "I'm fine and don't need those."  Again reminded her that we need a urine sample and she stated "I am going to do that in the morning."  She denied any pain or discomfort and appeared to be in no physical distress.  She is currently resting with her eyes closed and appears to be asleep. A:  1:1 with RN for support and encouragement.  Offered medications as ordered.  Q 15 minute checks maintained for safety.  Encouraged participation in group and unit activities.   R:  Suzanne [name] remains safe on the unit.  We will continue to monitor the progress towards her goals.

## 2018-03-30 NOTE — Tx Team (Signed)
Interdisciplinary Treatment and Diagnostic Plan Update  03/31/2018 Time of Session: 9:00am Suzanne [name] MRN: [id number]  Principal Diagnosis:  MDD (major depressive disorder), recurrent, severe, with psychosis ([place])  Secondary Diagnoses: Active Problems:   MDD (major depressive disorder), recurrent, severe, with psychosis ([place])   Current Medications:  Current Facility-Administered Medications  Medication Dose Route Frequency Provider Last Rate Last Dose  . acetaminophen (TYLENOL) tablet 650 mg  650 mg Oral Q6H PRN Derrill Center, NP      . alum & mag hydroxide-simeth (MAALOX/MYLANTA) 200-200-20 MG/5ML suspension 30 mL  30 mL Oral Q4H PRN Derrill Center, NP      . citalopram (CELEXA) tablet 10 mg  10 mg Oral BID [name], MD      . clonazePAM Bobbye [place]) tablet 0.25 mg  0.25 mg Oral TID [name], MD      . OLANZapine zydis (ZYPREXA) disintegrating tablet 5 mg  5 mg Oral Q8H PRN Cobos, Myer Peer, MD       And  . LORazepam (ATIVAN) tablet 1 mg  1 mg Oral PRN Cobos, Myer Peer, MD       And  . ziprasidone (GEODON) injection 20 mg  20 mg Intramuscular PRN Cobos, Fernando A, MD      . magnesium hydroxide (MILK OF MAGNESIA) suspension 30 mL  30 mL Oral Daily PRN Derrill Center, NP      . prenatal vitamin w/FE, FA (PRENATAL 1 + 1) 27-1 MG tablet 1 tablet  1 tablet Oral Q1200 [name], MD   1 tablet at 03/30/18 1134  . [START ON 04/01/2018] risperiDONE (RISPERDAL) tablet 2 mg  2 mg Oral Daily [name], MD      . risperiDONE (RISPERDAL) tablet 3 mg  3 mg Oral QHS [name], MD       PTA Medications: Medications Prior to Admission  Medication Sig Dispense Refill Last Dose  . cephALEXin (KEFLEX) 500 MG capsule Take 1 capsule (500 mg total) by mouth every 8 (eight) hours. 30 capsule 0   . citalopram (CELEXA) 20 MG tablet Take 20 mg by mouth daily.   Past Month at Unknown time    Patient Stressors: Marital or family conflict Medication change or noncompliance  Patient  Strengths: Ability for insight Capable of independent living  Treatment Modalities: Medication Management, Group therapy, Case management,  1 to 1 session with clinician, Psychoeducation, Recreational therapy.   Physician Treatment Plan for Primary Diagnosis: <principal problem not specified> Long Term Goal(s): Improvement in symptoms so as ready for discharge Improvement in symptoms so as ready for discharge   Short Term Goals: Ability to identify changes in lifestyle to reduce recurrence of condition will improve Ability to identify and develop effective coping behaviors will improve Ability to identify changes in lifestyle to reduce recurrence of condition will improve Ability to verbalize feelings will improve Ability to disclose and discuss suicidal ideas Ability to demonstrate self-control will improve Ability to identify and develop effective coping behaviors will improve Ability to maintain clinical measurements within normal limits will improve  Medication Management: Evaluate patient's response, side effects, and tolerance of medication regimen.  Therapeutic Interventions: 1 to 1 sessions, Unit Group sessions and Medication administration.  Evaluation of Outcomes: Not met  Physician Treatment Plan for Secondary Diagnosis: Active Problems:   MDD (major depressive disorder), recurrent, severe, with psychosis ([name])  Long Term Goal(s): Improvement in symptoms so as ready for discharge Improvement in symptoms so as ready for discharge   Short Term  Goals: Ability to identify changes in lifestyle to reduce recurrence of condition will improve Ability to identify and develop effective coping behaviors will improve Ability to identify changes in lifestyle to reduce recurrence of condition will improve Ability to verbalize feelings will improve Ability to disclose and discuss suicidal ideas Ability to demonstrate self-control will improve Ability to identify and develop  effective coping behaviors will improve Ability to maintain clinical measurements within normal limits will improve     Medication Management: Evaluate patient's response, side effects, and tolerance of medication regimen.  Therapeutic Interventions: 1 to 1 sessions, Unit Group sessions and Medication administration.  Evaluation of Outcomes: Not met  RN Treatment Plan for Primary Diagnosis: <principal problem not specified> Long Term Goal(s): Knowledge of disease and therapeutic regimen to maintain health will improve  Short Term Goals: Ability to remain free from injury will improve, Ability to verbalize frustration and anger appropriately will improve, Ability to demonstrate self-control, Ability to verbalize feelings will improve, Ability to disclose and discuss suicidal ideas and Compliance with prescribed medications will improve  Medication Management: RN will administer medications as ordered by provider, will assess and evaluate patient's response and provide education to patient for prescribed medication. RN will report any adverse and/or side effects to prescribing provider.  Therapeutic Interventions: 1 on 1 counseling sessions, Psychoeducation, Medication administration, Evaluate responses to treatment, Monitor vital signs and CBGs as ordered, Perform/monitor CIWA, COWS, AIMS and Fall Risk screenings as ordered, Perform wound care treatments as ordered.  Evaluation of Outcomes: Not met  LCSW Treatment Plan for Primary Diagnosis:  MDD (major depressive disorder), recurrent, severe, with psychosis ([place]) Long Term Goal(s): Safe transition to appropriate next level of care at discharge, Engage patient in therapeutic group addressing interpersonal concerns.  Short Term Goals: Engage patient in aftercare planning with referrals and resources, Increase social support, Increase ability to appropriately verbalize feelings, Increase emotional regulation and Increase skills for wellness and  recovery  Therapeutic Interventions: Assess for all discharge needs, 1 to 1 time with Social worker, Explore available resources and support systems, Assess for adequacy in community support network, Educate family and significant other(s) on suicide prevention, Complete Psychosocial Assessment, Interpersonal group therapy.  Evaluation of Outcomes: Not met  Progress in Treatment: Attending groups: Intermittently  Participating in groups: Yes, when she attends Taking medication as prescribed: Yes. Toleration medication: Yes. Family/Significant other contact made: SPE completed with pt; pt declined to consent to collateral contact.  Patient understands diagnosis: Yes. Discussing patient identified problems/goals with staff: Yes. Medical problems stabilized or resolved: No. Denies suicidal/homicidal ideation: Yes per self report  Issues/concerns per patient self-inventory: No.  New problem(s) identified: No, Describe:  CSW continuing to assess  New Short Term/Long Term Goal(s): medication management for mood stabilization; elimination of SI thoughts; development of comprehensive mental wellness/sobriety plan.  Patient Goals "To be released."  Discharge Plan or Barriers: Pt has declined all referrals. She continues to demonstrate bizarre behavior with delusions. [place] pamphlet, Mobile Crisis information, and AA/NA information provided to patient for additional community support and resources.   Reason for Continuation of Hospitalization: Mood instability, delusions, medication stabilization  Estimated Length of Stay: Monday, 04/03/18  Attendees: Patient:  03/31/2018 11:39 AM  Physician: Dr. Jake Samples MD 03/31/2018 11:39 AM  Nursing: Vladimir Faster RN; [facility] RN 03/31/2018 11:39 AM  RN Care Manager:x 03/31/2018 11:39 AM  Social Worker: Janice Norrie LCSW 03/31/2018 11:39 AM  Recreational Therapist: x 03/31/2018 11:39 AM  Other: Lindell Spar NP 03/31/2018 11:39 AM  Other:  03/31/2018 11:39 AM  Other:  03/31/2018 11:39 AM    Scribe for Treatment Team: Avelina Laine, LCSW 03/31/2018 11:39 AM

## 2018-03-30 NOTE — Progress Notes (Signed)
Recreation Therapy Notes  Date: 12.5.19 Time: 1000 Location: 500 Hall Dayroom  Group Topic: Goal Setting  Goal Area(s) Addresses:  Patient will be able to identify at least 3 life goals.  Patient will be able to identify benefit of investing in life goals.  Patient will be able to identify benefit of setting life goals.   Intervention: Worksheet  Activity: Goal Setting.  Patients were to set goals they want to accomplish within the next week, month, year and five years.  Patients were to also identify the obstacles they would face when trying to achieve their goals, what they need in order to be successful and what they could do immediately to get started towards their goal.  Education:  Discharge Planning, Coping Skills   Education Outcome: Acknowledges Education/In Group Clarification Provided/Needs Additional Education  Clinical Observations: Pt did not attend group.     Caroll RancherMarjette  Ambrosini, LRT/CTRS         Caroll RancherLindsay, Daxton Nydam A 03/30/2018 12:16 PM

## 2018-03-30 NOTE — Plan of Care (Signed)
Nurse discussed anxiety, depression and coping skills with patient.  

## 2018-03-30 NOTE — Progress Notes (Signed)
[facility] MD Progress Note  03/30/2018 10:08 AM Suzanne [name]  MRN:  [id number] Subjective:   The patient initially requested discharge she later changed her mind and perhaps was even delusional about her right being here so we have canceled her discharge I met with her with her social worker the patient was unable to articulate anything beyond the fact she wants her head to be clear but she was fully oriented denying hallucinations and no longer feeling that she was the victim of telepathy so it appears she is not comfortable going due to some ill-defined formal thought disorder but she is again rational and coherent and not suicidal  Principal Problem: Continued psychosis Diagnosis: Active Problems:   MDD (major depressive disorder), recurrent, severe, with psychosis ([place])  Total Time spent with patient: 20 minutes  Past Medical History:  Past Medical History:  Diagnosis Date  . Anxiety   . Fatty liver   . Peptic ulcer    History reviewed. No pertinent surgical history. Family History: History reviewed. No pertinent family history. Social History:  Social History   Substance and Sexual Activity  Alcohol Use Not Currently  . Frequency: Never   Comment: Last use was March 2019     Social History   Substance and Sexual Activity  Drug Use Never    Social History   Socioeconomic History  . Marital status: Single    Spouse name: Not on file  . Number of children: Not on file  . Years of education: Not on file  . Highest education level: Not on file  Occupational History  . Occupation: Unemployed  Social Needs  . Financial resource strain: Not on file  . Food insecurity:    Worry: Not on file    Inability: Not on file  . Transportation needs:    Medical: Not on file    Non-medical: Not on file  Tobacco Use  . Smoking status: Never Smoker  . Smokeless tobacco: Never Used  Substance and Sexual Activity  . Alcohol use: Not Currently    Frequency: Never    Comment: Last use was  March 2019  . Drug use: Never  . Sexual activity: Not Currently  Lifestyle  . Physical activity:    Days per week: Not on file    Minutes per session: Not on file  . Stress: Not on file  Relationships  . Social connections:    Talks on phone: Not on file    Gets together: Not on file    Attends religious service: Not on file    Active member of club or organization: Not on file    Attends meetings of clubs or organizations: Not on file    Relationship status: Not on file  Other Topics Concern  . Not on file  Social History Narrative   Pt stated that she is unemployed and currently without a fixed address.  Pt stated that she recently ran out of her Celexa.   Additional Social History:                         Sleep: Good  Appetite:  Good  Current Medications: Current Facility-Administered Medications  Medication Dose Route Frequency Provider Last Rate Last Dose  . acetaminophen (TYLENOL) tablet 650 mg  650 mg Oral Q6H PRN Derrill Center, NP      . alum & mag hydroxide-simeth (MAALOX/MYLANTA) 200-200-20 MG/5ML suspension 30 mL  30 mL Oral Q4H PRN Derrill Center,  NP      . citalopram (CELEXA) tablet 10 mg  10 mg Oral BID [name], MD      . clonazePAM Bobbye [place]) tablet 0.25 mg  0.25 mg Oral TID [name], MD      . OLANZapine zydis (ZYPREXA) disintegrating tablet 5 mg  5 mg Oral Q8H PRN Cobos, Myer Peer, MD       And  . LORazepam (ATIVAN) tablet 1 mg  1 mg Oral PRN Cobos, Myer Peer, MD       And  . ziprasidone (GEODON) injection 20 mg  20 mg Intramuscular PRN Cobos, Fernando A, MD      . magnesium hydroxide (MILK OF MAGNESIA) suspension 30 mL  30 mL Oral Daily PRN Derrill Center, NP      . prenatal vitamin w/FE, FA (PRENATAL 1 + 1) 27-1 MG tablet 1 tablet  1 tablet Oral Q1200 [name], MD   1 tablet at 03/29/18 1308  . risperiDONE (RISPERDAL) tablet 1 mg  1 mg Oral Daily [name], MD      . risperiDONE (RISPERDAL) tablet 3 mg  3 mg Oral QHS [name], MD        Lab Results: No results found for this or any previous visit (from the past 48 hour(s)).  Blood Alcohol level:  Lab Results  Component Value Date   ETH <10 03/26/2018   ETH <10 42/35/3614    Metabolic Disorder Labs: No results found for: HGBA1C, MPG No results found for: PROLACTIN No results found for: CHOL, TRIG, HDL, CHOLHDL, VLDL, LDLCALC  Physical Findings: AIMS: Facial and Oral Movements Muscles of Facial Expression: None, normal Lips and Perioral Area: None, normal Jaw: None, normal Tongue: None, normal,Extremity Movements Upper (arms, wrists, hands, fingers): None, normal Lower (legs, knees, ankles, toes): None, normal, Trunk Movements Neck, shoulders, hips: None, normal, Overall Severity Severity of abnormal movements (highest score from questions above): None, normal Incapacitation due to abnormal movements: None, normal Patient's awareness of abnormal movements (rate only patient's report): No Awareness, Dental Status Current problems with teeth and/or dentures?: No Does patient usually wear dentures?: No  CIWA:  CIWA-Ar Total: 1 COWS:  COWS Total Score: 3  Musculoskeletal: Strength & Muscle Tone: within normal limits Gait & Station: normal Patient leans: N/A  Psychiatric Specialty Exam: Physical Exam  ROS  Blood pressure (!) 133/92, pulse (!) 104, temperature 98.4 F (36.9 C), temperature source Oral, resp. rate 18, height 5' 1"  (1.549 m), weight 67.1 kg, last menstrual period 02/20/2018, SpO2 99 %.Body mass index is 27.96 kg/m.  General Appearance: Casual  Eye Contact:  Good  Speech:  Clear and Coherent  Volume:  Normal  Mood:  Anxious  Affect:  Appropriate  Thought Process:  Disorganized  Orientation:  Full (Time, Place, and Person)  Thought Content:  Illogical  Suicidal Thoughts:  No  Homicidal Thoughts:  No  Memory:  Immediate;   Good  Judgement:  Good  Insight:  Good  Psychomotor Activity:  Decreased  Concentration:   Concentration: Good  Recall:  Good  Fund of Knowledge:  Good  Language:  Good  Akathisia:  NA  Handed:  Right  AIMS (if indicated):     Assets:  Communication Skills Desire for Improvement  ADL's:  Intact  Cognition:  WNL  Sleep:  Number of Hours: 6.75  The above template does not adequately describe her mental status exam but as above she is alert and oriented fully she is cooperative her eye  contact is good her speech is normal rate and tone.  She is not exactly disorganized but reporting an ill-defined difficulty concentrating and ill-defined cognitive issues that make her feel she is not baseline.  Marland KitchenDischarge to [facility] tomorrow and make med adjustments today continue cognitive and reality based therapies and current precautions Treatment Plan Summary: Daily contact with patient to assess and evaluate symptoms and progress in treatment and Medication management  [name], MD 03/30/2018, 10:08 AM

## 2018-03-30 NOTE — Progress Notes (Signed)
D:  Patient denied SI and HI, contracts for safety.  Patient denied A/V hallucination.  Patient continues to cover her ears with her hands.  Patient stares at walls as if she is seeing someone or something.   A:  Patient has refused all medications today, but did take her vitamin which she took yesterday.  R:  Safety maintained with 15 minute checks.

## 2018-03-30 NOTE — [facility] Suicide Risk Assessment (Signed)
[facility]   Principal Problem: Depression with psychosis Discharge Diagnoses: Active Problems:   MDD (major depressive disorder), recurrent, severe, with psychosis ([facility])   Total Time spent with patient: 30 minutes  Musculoskeletal: Strength & Muscle Tone: within normal limits Gait & Station: normal Patient leans: N/A  Psychiatric Specialty Exam: ROS  Blood pressure (!) 133/92, pulse (!) 104, temperature 98.4 F (36.9 C), temperature source Oral, resp. rate 18, height 5\' 1"  (1.549 m), weight 67.1 kg, last menstrual period 02/20/2018, SpO2 99 %.Body mass index is 27.96 kg/m.  General Appearance: Casual  Eye Contact::  Good  Speech:  Clear and Coherent409  Volume:  Normal  Mood:  Dysphoric  Affect:  Constricted  Thought Process:  Coherent  Orientation:  Full (Time, Place, and Person)  Thought Content:  Logical  Suicidal Thoughts:  No  Homicidal Thoughts:  No  Memory:  Immediate;   Good  Judgement:  Good  Insight:  Good  Psychomotor Activity:  Normal  Concentration:  Good  Recall:  Good  Fund of Knowledge:Good  Language: Good  Akathisia:  Negative  Handed:  Right  AIMS (if indicated):     Assets:  Communication Skills  Sleep:  Number of Hours: 6.75  Cognition: WNL  ADL's:  Intact   Mental Status Per Nursing Assessment::   On Admission:  Suicidal ideation indicated by others, Self-harm behaviors  Demographic Factors:  Unemployed  Loss Factors: Decrease in vocational status  Historical Factors: NA  Risk Reduction Factors:   Religious beliefs about death  Continued Clinical Symptoms:  Depression:   Severe  Cognitive Features That Contribute To Risk:  None    Suicide Risk:  Minimal: No identifiable suicidal ideation.  Patients presenting with no risk factors but with morbid ruminations; may be classified as minimal risk based on the severity of the depressive symptoms    Plan Of Care/Follow-up recommendations:  Activity:   full  Daymen Hassebrock, MD 03/30/2018, 8:52 AM

## 2018-03-30 NOTE — Care Plan (Signed)
Patient actually stated she did not want to leave after initially claiming that her ride was here. States she wants to stay longer to get her head clear. She continues to deny auditory visual hallucinations and states the thought insertion and telepathy she was experiencing is a "thing of the past" but states she is not ready to go. We will honor this request hold her another day because clearly she is having more psychotic symptoms though they are difficult for her to articulate. Escalate Risperdal by a milligram

## 2018-03-31 MED ORDER — RISPERIDONE 2 MG PO TABS
2.0000 mg | ORAL_TABLET | Freq: Every day | ORAL | Status: DC
  Administered 2018-04-03: 2 mg via ORAL
  Filled 2018-03-31 (×4): qty 1

## 2018-03-31 MED ORDER — DIPHENHYDRAMINE HCL 50 MG/ML IJ SOLN
INTRAMUSCULAR | Status: AC
  Administered 2018-03-31: 50 mg via INTRAMUSCULAR
  Filled 2018-03-31: qty 1

## 2018-03-31 MED ORDER — LORAZEPAM 2 MG/ML IJ SOLN
INTRAMUSCULAR | Status: AC
  Administered 2018-03-31: 2 mg via INTRAMUSCULAR
  Filled 2018-03-31: qty 1

## 2018-03-31 MED ORDER — LORAZEPAM 2 MG/ML IJ SOLN
2.0000 mg | Freq: Once | INTRAMUSCULAR | Status: AC
  Administered 2018-03-31: 2 mg via INTRAMUSCULAR

## 2018-03-31 MED ORDER — DIPHENHYDRAMINE HCL 50 MG/ML IJ SOLN
50.0000 mg | Freq: Once | INTRAMUSCULAR | Status: AC
  Administered 2018-03-31: 50 mg via INTRAMUSCULAR
  Filled 2018-03-31: qty 1

## 2018-03-31 NOTE — Progress Notes (Signed)
Adult Psychoeducational Group Note  Date:  03/31/2018 Time:  12:48 AM  Group Topic/Focus:  Wrap-Up Group:   The focus of this group is to help patients review their daily goal of treatment and discuss progress on daily workbooks.  Participation Level:  Did Not Attend  Participation Quality:  Did not attend  Affect:  Did not attend  Cognitive:  Did not attend  Insight: None  Engagement in Group:  Did not attend  Modes of Intervention:  Did not attend  Additional Comments:  Pt did not attend evening wrap up group.  Suzanne [name]  Suzanne [name] 03/31/2018, 12:48 AM

## 2018-03-31 NOTE — Progress Notes (Signed)
[facility] MD Progress Note  03/31/2018 10:18 AM Suzanne [name]  MRN:  [id number] Subjective:   Patient has become progressively more bizarre since yesterday morning.  Yesterday morning she requested discharge and changed her mind it was possibly even delusional thinking her ride was outside for her. This morning I attempted rounds before team the patient stated she "needed a moment" and refused to cooperate with a mental status exam.  After 1/2-hour or so I went back and she continued to state that she needed privacy to "pray" it would not fully cooperate with a mental status exam but wanted to be left alone.  She did however deny auditory and visual loose Nations and denied thoughts of harming herself.  Again has seemingly become more psychotic not less as days have progressed.   Principal Problem: <principal problem not specified> Diagnosis: Active Problems:   MDD (major depressive disorder), recurrent, severe, with psychosis (HCC)  Total Time spent with patient: 30 minutes  Past Medical History:  Past Medical History:  Diagnosis Date  . Anxiety   . Fatty liver   . Peptic ulcer    History reviewed. No pertinent surgical history. Family History: History reviewed. No pertinent family history.  Social History:  Social History   Substance and Sexual Activity  Alcohol Use Not Currently  . Frequency: Never   Comment: Last use was March 2019     Social History   Substance and Sexual Activity  Drug Use Never    Social History   Socioeconomic History  . Marital status: Single    Spouse name: Not on file  . Number of children: Not on file  . Years of education: Not on file  . Highest education level: Not on file  Occupational History  . Occupation: Unemployed  Social Needs  . Financial resource strain: Not on file  . Food insecurity:    Worry: Not on file    Inability: Not on file  . Transportation needs:    Medical: Not on file    Non-medical: Not on file  Tobacco Use  . Smoking  status: Never Smoker  . Smokeless tobacco: Never Used  Substance and Sexual Activity  . Alcohol use: Not Currently    Frequency: Never    Comment: Last use was March 2019  . Drug use: Never  . Sexual activity: Not Currently  Lifestyle  . Physical activity:    Days per week: Not on file    Minutes per session: Not on file  . Stress: Not on file  Relationships  . Social connections:    Talks on phone: Not on file    Gets together: Not on file    Attends religious service: Not on file    Active member of club or organization: Not on file    Attends meetings of clubs or organizations: Not on file    Relationship status: Not on file  Other Topics Concern  . Not on file  Social History Narrative   Pt stated that she is unemployed and currently without a fixed address.  Pt stated that she recently ran out of her Celexa.   Additional Social History:                         Sleep: Fair  Appetite:  Fair  Current Medications: Current Facility-Administered Medications  Medication Dose Route Frequency Provider Last Rate Last Dose  . acetaminophen (TYLENOL) tablet 650 mg  650 mg Oral Q6H PRN Melvyn Neth,  Jerene Pitchanika N, NP      . alum & mag hydroxide-simeth (MAALOX/MYLANTA) 200-200-20 MG/5ML suspension 30 mL  30 mL Oral Q4H PRN Oneta RackLewis, Tanika N, NP      . citalopram (CELEXA) tablet 10 mg  10 mg Oral BID Malvin JohnsFarah, Jamaiyah Pyle, MD      . clonazePAM Scarlette Calico(KLONOPIN) tablet 0.25 mg  0.25 mg Oral TID Malvin JohnsFarah, Renne Platts, MD      . OLANZapine zydis (ZYPREXA) disintegrating tablet 5 mg  5 mg Oral Q8H PRN Cobos, Rockey SituFernando A, MD       And  . LORazepam (ATIVAN) tablet 1 mg  1 mg Oral PRN Cobos, Rockey SituFernando A, MD       And  . ziprasidone (GEODON) injection 20 mg  20 mg Intramuscular PRN Cobos, Suzanne A, MD      . magnesium hydroxide (MILK OF MAGNESIA) suspension 30 mL  30 mL Oral Daily PRN Oneta RackLewis, Tanika N, NP      . prenatal vitamin w/FE, FA (PRENATAL 1 + 1) 27-1 MG tablet 1 tablet  1 tablet Oral Q1200 Malvin JohnsFarah, Vicky Mccanless, MD    1 tablet at 03/30/18 1134  . [START ON 04/01/2018] risperiDONE (RISPERDAL) tablet 2 mg  2 mg Oral Daily Malvin JohnsFarah, Deniel Mcquiston, MD      . risperiDONE (RISPERDAL) tablet 3 mg  3 mg Oral QHS Malvin JohnsFarah, Guenevere Roorda, MD        Lab Results: No results found for this or any previous visit (from the past 48 hour(s)).  Blood Alcohol level:  Lab Results  Component Value Date   ETH <10 03/26/2018   ETH <10 03/23/2018    Metabolic Disorder Labs: No results found for: HGBA1C, MPG No results found for: PROLACTIN No results found for: CHOL, TRIG, HDL, CHOLHDL, VLDL, LDLCALC  Physical Findings: AIMS: Facial and Oral Movements Muscles of Facial Expression: None, normal Lips and Perioral Area: None, normal Jaw: None, normal Tongue: None, normal,Extremity Movements Upper (arms, wrists, hands, fingers): None, normal Lower (legs, knees, ankles, toes): None, normal, Trunk Movements Neck, shoulders, hips: None, normal, Overall Severity Severity of abnormal movements (highest score from questions above): None, normal Incapacitation due to abnormal movements: None, normal Patient's awareness of abnormal movements (rate only patient's report): No Awareness, Dental Status Current problems with teeth and/or dentures?: No Does patient usually wear dentures?: No  CIWA:  CIWA-Ar Total: 1 COWS:  COWS Total Score: 3  Musculoskeletal: Strength & Muscle Tone: within normal limits Gait & Station: normal Patient leans: N/A  Psychiatric Specialty Exam: Physical Exam  ROS  Blood pressure (!) 133/92, pulse (!) 104, temperature 98.4 F (36.9 C), temperature source Oral, resp. rate 18, height 5\' 1"  (1.549 m), weight 67.1 kg, last menstrual period 02/20/2018, SpO2 99 %.Body mass index is 27.96 kg/m.  General Appearance: Casual  Eye Contact:  Fair  Speech:  Clear and Coherent  Volume:  Decreased  Mood:  Dysphoric  Affect:  Congruent and Constricted  Thought Process:  Irrelevant  Orientation:  Full (Time, Place, and Person)   Thought Content:  Delusions  Suicidal Thoughts:  No  Homicidal Thoughts:  No  Memory:  Immediate;   Poor  Judgement:  Impaired  Insight:  Lacking  Psychomotor Activity:  Psychomotor Retardation  Concentration:  Concentration: Poor  Recall:  Poor  Fund of Knowledge:  Poor  Language:  Poor  Akathisia:  Negative  Handed:  Right  AIMS (if indicated):     Assets:  Desire for Improvement  ADL's:  Intact  Cognition:  Impaired,  Mild  Sleep:  Number of Hours: 0   In summary mental status exam is marked by resistance to the exam itself so much information is missing but she denied current hallucinations or thoughts of self-harm but apparently there is some psychotic process this disallowing her from full participation  Treatment Plan Summary: Daily contact with patient to assess and evaluate symptoms and progress in treatment and Medication management Escalate Risperdal continue reality based therapy Shallyn Constancio, MD 03/31/2018, 10:18 AM

## 2018-03-31 NOTE — Progress Notes (Addendum)
D:  Suzanne [name] was up and visible on the unit this evening.  She spent much of the evening in the day room watching TV with her peers.  She denied SI/HI or A/V hallucinations.  She declined needing medications this evening.  She denied any pain or discomfort and appeared to be in no physical distress.  She reported her day was good.  She was encouraged again to give urine specimen and she stated "ok, I will bring it too you soon."  She has yet to bring the specimen.  A:  1:1 with RN for support and encouragement.  Offered medications as ordered.  Q 15 minute checks maintained for safety.  Encouraged participation in group and unit activities.   R:  Suzanne [name] remains safe on the unit.  We will continue to monitor the progress towards her goals.

## 2018-03-31 NOTE — [facility] Group Notes (Signed)
Pt attended spiritual care group led by Wilkie Ayehaplain Annalysia Willenbring, MDiv, [facility].   Group focused on topic of "hope."  Patients engaged in facilitated dialog around topic, discussing where they experience hope in their circumstances and one thing they are hopeful for over next 24 hours.  Interventions: facilitated group discussion, adlerian / client centered   [name] was present throughout group.  Engaged in group discussion when prompted.  Noted hope as "staying positive" and reflected on strategies for choosing to change her perspective when she is feeling overwhelmed.  Described her nature walks as centering.  Stated she is working on letting go of things she can't control and investing her energy in making positive change in her life.    Burnis KingfisherMatthew Aalyah Mansouri, MDiv, [facility]

## 2018-03-31 NOTE — Plan of Care (Signed)
  Problem: Activity: Goal: Interest or engagement in activities will improve Outcome: Not Progressing   Problem: Safety: Goal: Periods of time without injury will increase Outcome: Progressing  Dar Note: Patient presents with flat affect and depressed mood.  Continues to refused prescribed medications.  Remained withdrawn and isolates to her room for majority of this shift.  Refused to engage or cooperate with assessment.  Routine safety checks maintained every 15 minutes.  Offered support and encouragement as needed.  Patient is safe on the unit.

## 2018-03-31 NOTE — Plan of Care (Addendum)
  Problem: Activity: Goal: Sleeping patterns will improve Outcome: Completed/Met Note:  Suzanne [name], per report, has been sleeping well without medications.

## 2018-04-01 NOTE — Plan of Care (Signed)
D: Patient presents paranoid, angry. She wanted me to chart that she was very upset with staff last night. She smelled a "rat bomb" in her room, and felt staff were unresponsive to her concerns. She refused medication at that time, and required IM medications. She states "they gave me meds, but didn't give me a chance to explain what was going on. I don't even know what meds I got." I reviewed the medications she received with her. She has been sleeping all morning. She complains of symptoms of a UTI, but is refusing to give a urine specimen to confirm. She seems paranoid, and irritable. Patient denies SI/HI/AVH.  A: Patient checked q15 min, and checks reviewed. Reviewed medication changes with patient and educated on side effects. Educated patient on importance of attending group therapy sessions and educated on several coping skills. Encouarged participation in milieu through recreation therapy and attending meals with peers. Support and encouragement provided. Fluids offered. R: Patient receptive to education on medications, and is medication compliant. Patient contracts for safety on the unit. Patient refused to fill out a self-inventory sheet.

## 2018-04-01 NOTE — Plan of Care (Signed)
  Problem: Coping: Goal: Ability to verbalize frustrations and anger appropriately will improve Outcome: Not Progressing Goal: Ability to demonstrate self-control will improve Outcome: Not Progressing   

## 2018-04-01 NOTE — [facility] Group Notes (Signed)
LCSW Group Therapy Note  04/01/2018     11:15AM-12:00PM  Type of Therapy and Topic:  Group Therapy:  Self Sabotage  Participation Level:  Active        . Description of Group:  Today's process group focused on the topic of Self Sabotage, what this is, and what methods of self-sabotage patients in the group have found themselves using.  Commonalities were then pointed out and the group explored possible benefits of choosing healthier coping skills.  Patients were asked to rate both their commitment to change and their confidence in their ability to change from 1 (lowest) to 10 (highest), then asked about their answers in order to provoke change talk.   Therapeutic Goals 1. Patient will be able to identify their typical methods of self sabotage. 2. Patient will list reasons they engage in these destructive behaviors, and harm that comes from them 3. Patient will be able verbalize the costs and benefits of drinking/drugging versus making the choice to change 4. Patient will rate their commitment to change and confidence about their ability to change, and will be guided to change talk.  Summary of Patient Progress: During group, patient expressed her typical manner of self-sabotage is overthinking. Her  confidence in her ability to change was rated "very high" because she really wants to get better and was sufficiently insightful to see what the overthinking does to her wellness.  Therapeutic Modalities Stages of Change Motivational Interviewing  Ambrose MantleMareida Grossman-Orr, LCSW 04/01/2018, 8:24 AM

## 2018-04-01 NOTE — Progress Notes (Signed)
D: Patient observed in room at the start of this writer's shift. Approached patient asking her name to which patient replied, "no, I'm not [place]. You need to leave right now, and take your little friend with you. Get out of here, now." Patient became increasingly agitated as evening progressed demanding to watch tv in the dayroom after it was closed, per unit rules, at 2300. Patient also demanding to make a phone call stating, "I don't think you understand. 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 If you won't let me use the patient phone, I demand to use a cell phone that I know you have." when patient was told this was not possible, patient began going into other patients' rooms and many were awoken on hall. Patient loudly demanding an alternate room and states, "there are rat bombs in my room! Don't you smell them?" Patient was unable to be redirected. Refusing to take any po medications for anxiety, agitation or sleep. Refused offer of scheduled and prn medications multiple times. Patient's affect angry, mood hostile and suspicious.    AMelvenia Beam: Simon, PA consulted and orders received. With show of support by staff, patient consented to IM medications (see eMAR) and meds given without difficulty. Brief medication education provided. Level III obs in place for safety. Emotional support attempted.   R: On reassess, patient is asleep. Will continue to monitor throughout the night.

## 2018-04-01 NOTE — Progress Notes (Signed)
D: Patient approached staff stating "I need to be taken to lobby." When asked her reason, she states "I don't need this treatment." Patient expresses that she is ready for discharge. She stated to [facility] that nurses are actors in a grand play that is stacked against her. She believes we are holding her here against her will. She stated "If you don't let me go, then I am about to go off." A: Emotional support and medication offered. Patient declined. R: Patient went calmly to dayroom.

## 2018-04-01 NOTE — Progress Notes (Signed)
Patient shared om group that she had a "fantastic day" since she was able to meet with her peers. Her goal for tomorrow is to get more exercise.

## 2018-04-01 NOTE — Progress Notes (Addendum)
[facility] MD Progress Note  04/01/2018 10:28 AM Tynika Luddy  MRN:  [id number]  Subjective: Kelly reports, "I'm good. My mood is fine. I came to this hospital because of dehydrations".   (Per Md's admission evaluation): 37 year old female, presented to ED on 11/28 via [employer]. As per ED notes, patient felt family was ploltting to kill her. Collateral information was obtained from mother in ED, who reported patient presented with weight loss, poor grooming , exhibiting a bizarre affect, paranoia, had disconnected electronics at home with concerns that she was being monitored . Was monitored and discharged from ED on 11/30. On 12/1 presented to ED again, via [employer] , after being observed standing on a bridge, looking over the edge , reporting worsening depression and anxiety. As per ED notes, when [employer] arrived  patient tried to jump. At this time patient presents as fair historian, guarded, paranoid, with an rritable,angry affect. States she does not feel she needs to be hospitalized in a psychiatric unit and that reason for ED visit was dehydration. Refuses to answer some questions regarding events that led to admission , stating that they are irrelevant . Does report she overheard her family making threats towards her, due to which she felt scared and left house. Reports her mother followed her and she felt threatened , so went to a neighbor's house.   Saraia is seen, chart reviewed. The chart findings discussed with the treatment team. She was lying down in bed. She is verbally responsive, making fair eye contact. She denies any issues. She says her mood is fine, rather came to the hospital due to dehydration. Staff reports that has been disruptive & loud on the unit. Apparently, had barracked herself in her room last night due to delusions, claiming that she was a [employer]. She was calm & cooperative during this follow-up evaluation. Denies any SIHI, AVH. She will remain on her current plan of care as already in progress.  Agitation/psychosis protocols already in place.  (Per previous Md's evaluation): Patient has become progressively more bizarre since yesterday morning.  Yesterday morning she requested discharge and changed her mind it was possibly even delusional thinking her ride was outside for her. This morning I attempted rounds before team the patient stated she "needed a moment" and refused to cooperate with a mental status exam.  After 1/2-hour or so I went back and she continued to state that she needed privacy to "pray" it would not fully cooperate with a mental status exam but wanted to be left alone.  She did however deny auditory and visual loose Nations and denied thoughts of harming herself.  Again has seemingly become more psychotic not less as days have progressed.  Principal Problem: <principal problem not specified>  Diagnosis: Active Problems:   MDD (major depressive disorder), recurrent, severe, with psychosis (HCC)  Total Time spent with patient: 15 minutes  Past Medical History:  Past Medical History:  Diagnosis Date  . Anxiety   . Fatty liver   . Peptic ulcer    History reviewed. No pertinent surgical history.  Family History: History reviewed. No pertinent family history.  Social History:  Social History   Substance and Sexual Activity  Alcohol Use Not Currently  . Frequency: Never   Comment: Last use was March 2019     Social History   Substance and Sexual Activity  Drug Use Never    Social History   Socioeconomic History  . Marital status: Single    Spouse name: Not  on file  . Number of children: Not on file  . Years of education: Not on file  . Highest education level: Not on file  Occupational History  . Occupation: Unemployed  Social Needs  . Financial resource strain: Not on file  . Food insecurity:    Worry: Not on file    Inability: Not on file  . Transportation needs:    Medical: Not on file    Non-medical: Not on file  Tobacco Use  . Smoking  status: Never Smoker  . Smokeless tobacco: Never Used  Substance and Sexual Activity  . Alcohol use: Not Currently    Frequency: Never    Comment: Last use was March 2019  . Drug use: Never  . Sexual activity: Not Currently  Lifestyle  . Physical activity:    Days per week: Not on file    Minutes per session: Not on file  . Stress: Not on file  Relationships  . Social connections:    Talks on phone: Not on file    Gets together: Not on file    Attends religious service: Not on file    Active member of club or organization: Not on file    Attends meetings of clubs or organizations: Not on file    Relationship status: Not on file  Other Topics Concern  . Not on file  Social History Narrative   Pt stated that she is unemployed and currently without a fixed address.  Pt stated that she recently ran out of her Celexa.   Additional Social History:   Sleep: Fair  Appetite:  Fair  Current Medications: Current Facility-Administered Medications  Medication Dose Route Frequency Provider Last Rate Last Dose  . acetaminophen (TYLENOL) tablet 650 mg  650 mg Oral Q6H PRN Oneta RackLewis, Tanika N, NP      . alum & mag hydroxide-simeth (MAALOX/MYLANTA) 200-200-20 MG/5ML suspension 30 mL  30 mL Oral Q4H PRN Oneta RackLewis, Tanika N, NP      . citalopram (CELEXA) tablet 10 mg  10 mg Oral BID Malvin JohnsFarah, Brian, MD      . OLANZapine zydis (ZYPREXA) disintegrating tablet 5 mg  5 mg Oral Q8H PRN Marylu Dudenhoeffer, Rockey SituFernando A, MD       And  . LORazepam (ATIVAN) tablet 1 mg  1 mg Oral PRN Radie Berges, Rockey SituFernando A, MD      . magnesium hydroxide (MILK OF MAGNESIA) suspension 30 mL  30 mL Oral Daily PRN Oneta RackLewis, Tanika N, NP      . prenatal vitamin w/FE, FA (PRENATAL 1 + 1) 27-1 MG tablet 1 tablet  1 tablet Oral Q1200 Malvin JohnsFarah, Brian, MD   1 tablet at 03/30/18 1134  . risperiDONE (RISPERDAL) tablet 2 mg  2 mg Oral Daily Malvin JohnsFarah, Brian, MD      . risperiDONE (RISPERDAL) tablet 3 mg  3 mg Oral QHS Malvin JohnsFarah, Brian, MD       Lab Results: No results  found for this or any previous visit (from the past 48 hour(s)).  Blood Alcohol level:  Lab Results  Component Value Date   ETH <10 03/26/2018   ETH <10 03/23/2018   Metabolic Disorder Labs: No results found for: HGBA1C, MPG No results found for: PROLACTIN No results found for: CHOL, TRIG, HDL, CHOLHDL, VLDL, LDLCALC  Physical Findings: AIMS: Facial and Oral Movements Muscles of Facial Expression: None, normal Lips and Perioral Area: None, normal Jaw: None, normal Tongue: None, normal,Extremity Movements Upper (arms, wrists, hands, fingers): None, normal Lower (legs, knees,  ankles, toes): None, normal, Trunk Movements Neck, shoulders, hips: None, normal, Overall Severity Severity of abnormal movements (highest score from questions above): None, normal Incapacitation due to abnormal movements: None, normal Patient's awareness of abnormal movements (rate only patient's report): No Awareness, Dental Status Current problems with teeth and/or dentures?: No Does patient usually wear dentures?: No  CIWA:  CIWA-Ar Total: 1 COWS:  COWS Total Score: 3  Musculoskeletal: Strength & Muscle Tone: within normal limits Gait & Station: normal Patient leans: N/A  Psychiatric Specialty Exam: Physical Exam  Nursing note and vitals reviewed.   Review of Systems  Respiratory: Negative.  Negative for cough and shortness of breath.   Cardiovascular: Negative.  Negative for chest pain and palpitations.  Gastrointestinal: Negative.  Negative for abdominal pain, heartburn, nausea and vomiting.  Genitourinary: Negative.   Musculoskeletal: Negative.   Neurological: Negative.  Negative for dizziness and headaches.  Endo/Heme/Allergies: Negative.   Psychiatric/Behavioral: Positive for depression and hallucinations (Hx. Psychosis). Negative for memory loss, substance abuse and suicidal ideas. The patient is not nervous/anxious and does not have insomnia.     Blood pressure (!) 133/92, pulse (!) 104,  temperature 98.4 F (36.9 C), temperature source Oral, resp. rate 18, height 5\' 1"  (1.549 m), weight 67.1 kg, last menstrual period 02/20/2018, SpO2 99 %.Body mass index is 27.96 kg/m.  General Appearance: Casual  Eye Contact:  Fair  Speech:  Clear and Coherent  Volume:  Decreased  Mood:  Dysphoric  Affect:  Congruent and Constricted  Thought Process:  Irrelevant  Orientation:  Full (Time, Place, and Person)  Thought Content:  Delusions  Suicidal Thoughts:  No  Homicidal Thoughts:  No  Memory:  Immediate;   Poor  Judgement:  Impaired  Insight:  Lacking  Psychomotor Activity:  Psychomotor Retardation  Concentration:  Concentration: Poor  Recall:  Poor  Fund of Knowledge:  Poor  Language:  Poor  Akathisia:  Negative  Handed:  Right  AIMS (if indicated):     Assets:  Desire for Improvement  ADL's:  Intact  Cognition:  Impaired,  Mild  Sleep:  Number of Hours: 5.5   Per Md's previous follow-up care evaluation: In summary mental status exam is marked by resistance to the exam itself so much information is missing but she denied current hallucinations or thoughts of self-harm but apparently there is some psychotic process this disallowing her from full participation  Treatment Plan Summary: Daily contact with patient to assess and evaluate symptoms and progress in treatment and Medication management.  - Continue inpatient hospitalization.  - Will continue today 04/01/2018 plan as below except where it is noted.  Depression.     - Continue Citalopram 10 mg po bid.  Mood control.     - Continue Risperdal 2 mg po daily.     - Continue Risperdal 3 mg po daily.  Agitation protocols.    - Continue Zyprexa Zydis 5 mg po Q 8 hours prn.    - &    - Lorazepam 1 mg po as needed x once.    - &    - Geodon 20 mg  As needed x 1 dose.  Patient to attend & participate in the group sessions.  Discharge disposition plan is ongoing.     Armandina Stammer, NP, pmhnp, fnp-bc 04/01/2018, 10:28  AMPatient ID: Charlene Brooke, female   DOB: April 11, 1981, 37 y.o.   MRN: [id number] Agree with NP progress note

## 2018-04-02 NOTE — Progress Notes (Signed)
D: Patient remains paranoid and delusional. Believes staff are imposters. Patient with minimal interaction with staff and is guarded, defensive and irritable. Patient's affect suspicious. Denies pain, physical complaints.   A: Attempted to medicate patient with both scheduled and prn medications however patient refuses. Level III obs in place for safety. Emotional support offered.  Encouraged to attend and participate in unit programming. Attempts to establish/strengthen rapport unsuccessful.   R: Patient denies SI/HI. She remains safe on level III obs. Will continue to monitor throughout the night.

## 2018-04-02 NOTE — [facility] Group Notes (Signed)
[facility] Group Notes:  (Nursing)  Date:  04/02/2018  Time:930 am Type of Therapy:  Nurse Education  Participation Level:  Did Not Attend  [name] 04/02/2018, 6:31 PM

## 2018-04-02 NOTE — Progress Notes (Addendum)
[facility]  04/02/2018 11:20 AM Suzanne [name]  MRN:  [id number]  Subjective: Suzanne [name] reports, "I'm doing a lot better. My mood is good. Now, can you leave my room?  (Per Md's admission evaluation): 37 year old female, presented to ED on 11/28 via [employer]. As per ED notes, patient felt family was ploltting to kill her. Collateral information was obtained from mother in ED, who reported patient presented with weight loss, poor grooming , exhibiting a bizarre affect, paranoia, had disconnected electronics at home with concerns that she was being monitored . Was monitored and discharged from ED on 11/30. On 12/1 presented to ED again, via [employer] , after being observed standing on a bridge, looking over the edge , reporting worsening depression and anxiety. As per ED notes, when [employer] arrived  patient tried to jump. At this time patient presents as fair historian, guarded, paranoid, with an rritable,angry affect. States she does not feel she needs to be hospitalized in a psychiatric unit and that reason for ED visit was dehydration. Refuses to answer some questions regarding events that led to admission , stating that they are irrelevant . Does report she overheard her family making threats towards her, due to which she felt scared and left house. Reports her mother followed her and she felt threatened , so went to a neighbor's house.   Suzanne [name] is seen, chart reviewed. The chart findings discussed with the treatment team. She was lying down in bed. She is verbally responsive, making fair eye contact. She denies any issues. She says her mood is good, rather came to the hospital due to dehydration, she reports. Staff reports that was disruptive & loud on the unit yesterday after she barracked herself in her room due to delusions, claiming that she was a [employer]. Today, she presents calm & cooperative during this follow-up evaluation. Denies any SIHI, AVH. She will remain on her current plan of care as already in progress.  Agitation/psychosis protocols already in place.  (Per previous Md's evaluation): Patient has become progressively more bizarre since yesterday morning.  Yesterday morning she requested discharge and changed her mind it was possibly even delusional thinking her ride was outside for her. This morning I attempted rounds before team the patient stated she "needed a moment" and refused to cooperate with a mental status exam.  After 1/2-hour or so I went back and she continued to state that she needed privacy to "pray" it would not fully cooperate with a mental status exam but wanted to be left alone.  She did however deny auditory and visual loose Nations and denied thoughts of harming herself.  Again has seemingly become more psychotic not less as days have progressed.  Principal Problem: <principal problem not specified>  Diagnosis: Active Problems:   MDD (major depressive disorder), recurrent, severe, with psychosis (HCC)  Total Time spent with patient: 15 minutes  Past Medical History:  Past Medical History:  Diagnosis Date  . Anxiety   . Fatty liver   . Peptic ulcer    History reviewed. No pertinent surgical history.  Family History: History reviewed. No pertinent family history.  Social History:  Social History   Substance and Sexual Activity  Alcohol Use Not Currently  . Frequency: Never   Comment: Last use was March 2019     Social History   Substance and Sexual Activity  Drug Use Never    Social History   Socioeconomic History  . Marital status: Single    Spouse name:  Not on file  . Number of children: Not on file  . Years of education: Not on file  . Highest education level: Not on file  Occupational History  . Occupation: Unemployed  Social Needs  . Financial resource strain: Not on file  . Food insecurity:    Worry: Not on file    Inability: Not on file  . Transportation needs:    Medical: Not on file    Non-medical: Not on file  Tobacco Use  . Smoking  status: Never Smoker  . Smokeless tobacco: Never Used  Substance and Sexual Activity  . Alcohol use: Not Currently    Frequency: Never    Comment: Last use was March 2019  . Drug use: Never  . Sexual activity: Not Currently  Lifestyle  . Physical activity:    Days per week: Not on file    Minutes per session: Not on file  . Stress: Not on file  Relationships  . Social connections:    Talks on phone: Not on file    Gets together: Not on file    Attends religious service: Not on file    Active member of club or organization: Not on file    Attends meetings of clubs or organizations: Not on file    Relationship status: Not on file  Other Topics Concern  . Not on file  Social History Narrative   Pt stated that she is unemployed and currently without a fixed address.  Pt stated that she recently ran out of her Celexa.   Additional Social History:   Sleep: Fair  Appetite:  Fair  Current Medications: Current Facility-Administered Medications  Medication Dose Route Frequency Provider Last Rate Last Dose  . acetaminophen (TYLENOL) tablet 650 mg  650 mg Oral Q6H PRN Oneta Rack, NP      . alum & mag hydroxide-simeth (MAALOX/MYLANTA) 200-200-20 MG/5ML suspension 30 mL  30 mL Oral Q4H PRN Oneta Rack, NP      . citalopram (CELEXA) tablet 10 mg  10 mg Oral BID Malvin Johns, MD      . OLANZapine zydis (ZYPREXA) disintegrating tablet 5 mg  5 mg Oral Q8H PRN Cobos, Rockey Situ, MD       And  . LORazepam (ATIVAN) tablet 1 mg  1 mg Oral PRN Cobos, Rockey Situ, MD      . magnesium hydroxide (MILK OF MAGNESIA) suspension 30 mL  30 mL Oral Daily PRN Oneta Rack, NP      . prenatal vitamin w/FE, FA (PRENATAL 1 + 1) 27-1 MG tablet 1 tablet  1 tablet Oral Q1200 Malvin Johns, MD   1 tablet at 04/01/18 1321  . risperiDONE (RISPERDAL) tablet 2 mg  2 mg Oral Daily Malvin Johns, MD      . risperiDONE (RISPERDAL) tablet 3 mg  3 mg Oral QHS Malvin Johns, MD       Lab Results: No results  found for this or any previous visit (from the past 48 hour(s)).  Blood Alcohol level:  Lab Results  Component Value Date   ETH <10 03/26/2018   ETH <10 03/23/2018   Metabolic Disorder Labs: No results found for: HGBA1C, MPG No results found for: PROLACTIN No results found for: CHOL, TRIG, HDL, CHOLHDL, VLDL, LDLCALC  Physical Findings: AIMS: Facial and Oral Movements Muscles of Facial Expression: None, normal Lips and Perioral Area: None, normal Jaw: None, normal Tongue: None, normal,Extremity Movements Upper (arms, wrists, hands, fingers): None, normal Lower (legs,  knees, ankles, toes): None, normal, Trunk Movements Neck, shoulders, hips: None, normal, Overall Severity Severity of abnormal movements (highest score from questions above): None, normal Incapacitation due to abnormal movements: None, normal Patient's awareness of abnormal movements (rate only patient's report): No Awareness, Dental Status Current problems with teeth and/or dentures?: No Does patient usually wear dentures?: No  CIWA:  CIWA-Ar Total: 2 COWS:  COWS Total Score: 2  Musculoskeletal: Strength & Muscle Tone: within normal limits Gait & Station: normal Patient leans: N/A  Psychiatric Specialty Exam: Physical Exam  Nursing note and vitals reviewed.   Review of Systems  Respiratory: Negative.  Negative for cough and shortness of breath.   Cardiovascular: Negative.  Negative for chest pain and palpitations.  Gastrointestinal: Negative.  Negative for abdominal pain, heartburn, nausea and vomiting.  Genitourinary: Negative.   Musculoskeletal: Negative.   Neurological: Negative.  Negative for dizziness and headaches.  Endo/Heme/Allergies: Negative.   Psychiatric/Behavioral: Positive for depression and hallucinations (Hx. Psychosis). Negative for memory loss, substance abuse and suicidal ideas. The patient is not nervous/anxious and does not have insomnia.     Blood pressure (!) 133/92, pulse (!) 104,  temperature 98.4 F (36.9 C), temperature source Oral, resp. rate 18, height 5\' 1"  (1.549 m), weight 67.1 kg, last menstrual period 02/20/2018, SpO2 99 %.Body mass index is 27.96 kg/m.  General Appearance: Casual  Eye Contact:  Fair  Speech:  Clear and Coherent  Volume:  Decreased  Mood:  Dysphoric  Affect:  Congruent and Constricted  Thought Process:  Irrelevant  Orientation:  Full (Time, Place, and Person)  Thought Content:  Delusions  Suicidal Thoughts:  No  Homicidal Thoughts:  No  Memory:  Immediate;   Poor  Judgement:  Impaired  Insight:  Lacking  Psychomotor Activity:  Psychomotor Retardation  Concentration:  Concentration: Poor  Recall:  Poor  Fund of Knowledge:  Poor  Language:  Poor  Akathisia:  Negative  Handed:  Right  AIMS (if indicated):     Assets:  Desire for Improvement  ADL's:  Intact  Cognition:  Impaired,  Mild  Sleep:  Number of Hours: 4   Per Md's previous follow-up care evaluation: In summary mental status exam is marked by resistance to the exam itself so much information is missing but she denied current hallucinations or thoughts of self-harm but apparently there is some psychotic process disallowing her from full participation.  Treatment Plan Summary: Daily contact with patient to assess and evaluate symptoms and progress in treatment and Medication management.  - Continue inpatient hospitalization.  - Will continue today 04/02/2018 plan as below except where it is noted.  Depression.     - Continue Citalopram 10 mg po bid.  Mood control.     - Continue Risperdal 2 mg po daily.     - Continue Risperdal 3 mg po daily.  Agitation protocols.    - Continue Zyprexa Zydis 5 mg po Q 8 hours prn.    - &    - Lorazepam 1 mg po as needed x once.    - &    - Geodon 20 mg  As needed x 1 dose.  Patient to attend & participate in the group sessions.  Discharge disposition plan is ongoing.     Armandina Stammer, NP, pmhnp, fnp-bc 04/02/2018, 11:20  AMPatient ID: Suzanne Brooke, female   DOB: [birth year], 38 y.o.   MRN: [id number] Agree with NP Progress Note

## 2018-04-02 NOTE — Progress Notes (Addendum)
D: Patient observed up on the unit at short intervals but is otherwise isolative to room. Patient is watchful and avoids interaction much of the time. Patient states, "let's stop the antics. You can let me go now. I know you all are actors." Patient's affect angry, mood labile. Denies pain, physical complaints.   A: Medications offered, brief education provided. Level III obs in place for safety. Emotional support offered. Encouraged to attend and participate in unit programming.   R: Patient dismisses this Clinical research associatewriter stating, "no, I'm fine. I don't need the medications." Patient has refused all psychotropic medications and remains paranoid, irritable and delusional. She denies SI/HI/AVH and remains safe on level III obs. Will continue to monitor throughout the night.

## 2018-04-02 NOTE — Plan of Care (Signed)
D: Patient presents paranoid, bizarre. I asked her to come into the hallway away from other patients in order to do her morning assessment. She agreed, and then got up and sat back down. She was asked again, but refused. She became mute and would not answer most questions. She continues to refuse medications. She is not participating in treatment. She will come into the dayroom from time to time. She is not eating much for meals, and prefers packaged foods. She eyes staff suspiciously. She also was scheduled a multivitamin at lunch, but stated she had not eaten. We suggested she take it with food, and she began to walk off. She later agreed to have it with dinner. Patient denies SI/HI/AVH.  A: Patient checked q15 min, and checks reviewed. Reviewed medication changes with patient and educated on side effects. Educated patient on importance of attending group therapy sessions and educated on several coping skills. Encouarged participation in milieu through recreation therapy and attending meals with peers. Support and encouragement provided. Fluids offered. Packaged foods provided. R: Patient contracts for safety on the unit.  Problem: Education: Goal: Emotional status will improve Outcome: Not Progressing Goal: Mental status will improve Outcome: Not Progressing   Problem: Activity: Goal: Interest or engagement in activities will improve Outcome: Not Progressing   Problem: Coping: Goal: Ability to verbalize frustrations and anger appropriately will improve Outcome: Not Progressing

## 2018-04-02 NOTE — Plan of Care (Signed)
  Problem: Coping: Goal: Ability to verbalize frustrations and anger appropriately will improve Outcome: Not Progressing Goal: Ability to demonstrate self-control will improve Outcome: Not Progressing   

## 2018-04-02 NOTE — [facility] Group Notes (Signed)
[facility] LCSW Group Therapy Note  Date/Time:  04/02/2018  11:00AM-12:00PM  Type of Therapy and Topic:  Group Therapy:  Music and Mood  Participation Level:  Did Not Attend   Description of Group: In this process group, members listened to a variety of genres of music and identified that different types of music evoke different responses.  Patients were encouraged to identify music that was soothing for them and music that was energizing for them.  Patients discussed how this knowledge can help with wellness and recovery in various ways including managing depression and anxiety as well as encouraging healthy sleep habits.    Therapeutic Goals: 1. Patients will explore the impact of different varieties of music on mood 2. Patients will verbalize the thoughts they have when listening to different types of music 3. Patients will identify music that is soothing to them as well as music that is energizing to them 4. Patients will discuss how to use this knowledge to assist in maintaining wellness and recovery 5. Patients will explore the use of music as a coping skill  Summary of Patient Progress:  N?A  Therapeutic Modalities: Solution Focused Brief Therapy Activity   Kiasia Chou Grossman-Orr, LCSW    

## 2018-04-02 NOTE — Plan of Care (Signed)
  Problem: Education: Goal: Emotional status will improve Outcome: Not Progressing   Problem: Health Behavior/Discharge Planning: Goal: Compliance with treatment plan for underlying cause of condition will improve Outcome: Not Progressing

## 2018-04-03 MED ORDER — RISPERIDONE 3 MG PO TABS
3.0000 mg | ORAL_TABLET | Freq: Two times a day (BID) | ORAL | Status: DC
  Administered 2018-04-04: 3 mg via ORAL
  Filled 2018-04-03 (×4): qty 1

## 2018-04-03 MED ORDER — LORAZEPAM 0.5 MG PO TABS
0.5000 mg | ORAL_TABLET | Freq: Three times a day (TID) | ORAL | Status: DC
  Administered 2018-04-04 (×2): 0.5 mg via ORAL
  Filled 2018-04-03 (×3): qty 1

## 2018-04-03 MED ORDER — BENZTROPINE MESYLATE 0.5 MG PO TABS
0.5000 mg | ORAL_TABLET | Freq: Two times a day (BID) | ORAL | Status: DC
  Administered 2018-04-03 – 2018-04-04 (×2): 0.5 mg via ORAL
  Filled 2018-04-03 (×6): qty 1

## 2018-04-03 MED ORDER — ARIPIPRAZOLE 2 MG PO TABS
2.0000 mg | ORAL_TABLET | Freq: Once | ORAL | Status: AC
  Administered 2018-04-03: 2 mg via ORAL
  Filled 2018-04-03 (×2): qty 1

## 2018-04-03 NOTE — Progress Notes (Signed)
D: Pt denies SI/HI/AVH. Pt suspicious, refusing medications, don't want to talk.  A: Pt was offered support and encouragement.  Pt was encourage to attend groups. Q 15 minute checks were done for safety.   R: safety maintained on unit.   Problem: Education: Goal: Emotional status will improve Outcome: Not Progressing   Problem: Education: Goal: Mental status will improve Outcome: Not Progressing   Problem: Activity: Goal: Interest or engagement in activities will improve Outcome: Not Progressing

## 2018-04-03 NOTE — Progress Notes (Signed)
Recreation Therapy Notes  Date: 12.9.19 Time: 1000 Location: 500 Hall Dayroom  Group Topic: Coping Skills  Goal Area(s) Addresses:  Patient will be able to identify positive coping skills.                  Patient will be able to identify benefit of using coping skills post d/c.  Intervention: Worksheet  Activity: Mind map.  LRT and patients filled in the first 8 boxes (depression, anxiety, anger, mood swings, drugs/alcohol, finances, transportation, family/friends) together with situations that would require the use of coping skills.  Patients were to then come up with three coping skills for each scenario individually before reforming as [name] group.  Education: PharmacologistCoping Skills, Building control surveyorDischarge Planning.   Education Outcome: Acknowledges understanding/In group clarification offered/Needs additional education.   Clinical Observations/Feedback: Pt did not attend group.     Caroll RancherMarjette Zylen [name], LRT/CTRS         Lillia AbedLindsay, Suzanne [name] 04/03/2018 1:23 PM

## 2018-04-03 NOTE — Progress Notes (Signed)
LCSW Group Therapy Notes 04/03/2018 2:15 PM  Type of Therapy and Topic: Group Therapy: Effective Communication  Participation Level: Did not attend  Description of Group:  In this group patients will be asked to identify their own styles of communication as well as defining and identifying passive, assertive, and aggressive styles of communication. Participants will identify strategies to communicate in a more assertive manner in an effort to appropriately meet their needs. This group will be process-oriented, with patients participating in exploration of their own experiences as well as giving and receiving support and challenge from other group members.  Therapeutic Goals: 1. Patient will identify their personal communication style. 2. Patient will identify passive, assertive, and aggressive forms of communication. 3. Patient will identify strategies for developing more effective communication to appropriately meet their needs.   Summary of Patient Progress Patient briefly entered day room during group and left shortly afterward. Did not participate.   Therapeutic Modalities:  Communication Skills Solution Focused Therapy Motivational Interviewing    Enid Cutterharlotte Ahren Pettinger, MSW, [facility]CSWA 04/03/2018 2:03 PM

## 2018-04-03 NOTE — Progress Notes (Signed)
[facility] MD Progress Note  04/03/2018 10:12 AM Suzanne [name]  MRN:  [id number] Subjective:    Patient has continued to refuse medication, refuses to cooperate with interviews, she is seen with her entire treatment team seen with nurses and social worker.  She is repeatedly stating she does not want an interview she wants time to herself to read the Bible.  She did however come out of her room asked for a mask because there were fumes in her room last evening.  She remains paranoid she remains psychotic and is refusing all medications except she states she will take "a multivitamin. We discussed with her that she has to make a decision either to stay and comply with medications or simply to go.  She is in fact homeless so she does not desire to go when she states she would begin to take medications. However as discussed in treatment team meeting she is not dangerous acutely to self does not have thoughts of harming self or others and therefore we cannot force medications on her.  Hopefully she will comply with oral medications and even long-acting injectable.  We will give her another 24 hours to begin to comply again she is had a similar pattern of this when previously hospitalized refusing treatment and at one point even requiring security escort to leave the property because she was refusing treatment but not acutely dangerous but was hoping to stay hospitalized probably for issues of secondary gain.  At this point however she does appear psychotic and does need treatment but is not acutely dangerous Principal Problem: <principal problem not specified> Diagnosis: Active Problems:   MDD (major depressive disorder), recurrent, severe, with psychosis (HCC)  Total Time spent with patient: 30 minutes  Past Medical History:  Past Medical History:  Diagnosis Date  . Anxiety   . Fatty liver   . Peptic ulcer    History reviewed. No pertinent surgical history. Family History: History reviewed. No  pertinent family history. Family Psychiatric  History: no new data Social History:  Social History   Substance and Sexual Activity  Alcohol Use Not Currently  . Frequency: Never   Comment: Last use was March 2019     Social History   Substance and Sexual Activity  Drug Use Never    Social History   Socioeconomic History  . Marital status: Single    Spouse name: Not on file  . Number of children: Not on file  . Years of education: Not on file  . Highest education level: Not on file  Occupational History  . Occupation: Unemployed  Social Needs  . Financial resource strain: Not on file  . Food insecurity:    Worry: Not on file    Inability: Not on file  . Transportation needs:    Medical: Not on file    Non-medical: Not on file  Tobacco Use  . Smoking status: Never Smoker  . Smokeless tobacco: Never Used  Substance and Sexual Activity  . Alcohol use: Not Currently    Frequency: Never    Comment: Last use was March 2019  . Drug use: Never  . Sexual activity: Not Currently  Lifestyle  . Physical activity:    Days per week: Not on file    Minutes per session: Not on file  . Stress: Not on file  Relationships  . Social connections:    Talks on phone: Not on file    Gets together: Not on file    Attends religious service:  Not on file    Active member of club or organization: Not on file    Attends meetings of clubs or organizations: Not on file    Relationship status: Not on file  Other Topics Concern  . Not on file  Social History Narrative   Pt stated that she is unemployed and currently without a fixed address.  Pt stated that she recently ran out of her Celexa.   Additional Social History:                         Sleep: Fair  Appetite:  Fair  Current Medications: Current Facility-Administered Medications  Medication Dose Route Frequency Provider Last Rate Last Dose  . acetaminophen (TYLENOL) tablet 650 mg  650 mg Oral Q6H PRN Oneta RackLewis, Tanika  N, NP      . alum & mag hydroxide-simeth (MAALOX/MYLANTA) 200-200-20 MG/5ML suspension 30 mL  30 mL Oral Q4H PRN Oneta RackLewis, Tanika N, NP      . benztropine (COGENTIN) tablet 0.5 mg  0.5 mg Oral BID Malvin JohnsFarah, Janiyah Beery, MD      . citalopram (CELEXA) tablet 10 mg  10 mg Oral BID Malvin JohnsFarah, Etrulia Zarr, MD   10 mg at 04/03/18 0913  . LORazepam (ATIVAN) tablet 0.5 mg  0.5 mg Oral TID Malvin JohnsFarah, Maylee Bare, MD      . OLANZapine zydis [facility](ZYPREXA) disintegrating tablet 5 mg  5 mg Oral Q8H PRN Cobos, Rockey SituFernando A, MD       And  . LORazepam (ATIVAN) tablet 1 mg  1 mg Oral PRN Cobos, Rockey SituFernando A, MD      . magnesium hydroxide (MILK OF MAGNESIA) suspension 30 mL  30 mL Oral Daily PRN Oneta RackLewis, Tanika N, NP      . prenatal vitamin w/FE, FA (PRENATAL 1 + 1) 27-1 MG tablet 1 tablet  1 tablet Oral Q1200 Malvin JohnsFarah, Desma Wilkowski, MD   1 tablet at 04/02/18 1656  . risperiDONE (RISPERDAL) tablet 3 mg  3 mg Oral BID Malvin JohnsFarah, Benford Asch, MD        Lab Results: No results found for this or any previous visit (from the past 48 hour(s)).  Blood Alcohol level:  Lab Results  Component Value Date   ETH <10 03/26/2018   ETH <10 03/23/2018    Metabolic Disorder Labs: No results found for: HGBA1C, MPG No results found for: PROLACTIN No results found for: CHOL, TRIG, HDL, CHOLHDL, VLDL, LDLCALC  Physical Findings: AIMS: Facial and Oral Movements Muscles of Facial Expression: None, normal Lips and Perioral Area: None, normal Jaw: None, normal Tongue: None, normal,Extremity Movements Upper (arms, wrists, hands, fingers): None, normal Lower (legs, knees, ankles, toes): None, normal, Trunk Movements Neck, shoulders, hips: None, normal, Overall Severity Severity of abnormal movements (highest score from questions above): None, normal Incapacitation due to abnormal movements: None, normal Patient's awareness of abnormal movements (rate only patient's report): No Awareness, Dental Status Current problems with teeth and/or dentures?: No Does patient usually wear  dentures?: No  CIWA:  CIWA-Ar Total: 2 COWS:  COWS Total Score: 2  Musculoskeletal: Strength & Muscle Tone: within normal limits Gait & Station: normal Patient leans: N/A  Psychiatric Specialty Exam: Physical Exam  ROS  Blood pressure (!) 133/92, pulse (!) 104, temperature 98.4 F (36.9 C), temperature source Oral, resp. rate 18, height 5\' 1"  (1.549 m), weight 67.1 kg, last menstrual period 02/20/2018, SpO2 99 %.Body mass index is 27.96 kg/m.  General Appearance: Disheveled  Eye Contact:  Fair  Speech:  Clear and Coherent  Volume:  Decreased  Mood:  Anxious, Dysphoric and Irritable  Affect:  Congruent  Thought Process:  Irrelevant  Orientation:  Full (Time, Place, and Person)  Thought Content:  Illogical  Suicidal Thoughts:  No  Homicidal Thoughts:  No  Memory:  Immediate;   Fair  Judgement:  Fair  Insight:  min  Psychomotor Activity:  Normal  Concentration:  Concentration: Fair  Recall:  [employer] of Knowledge:  Fair  Language:  Fair  Akathisia:  Negative  Handed:  Right  AIMS (if indicated):     Assets:  Social Support may be present ukn  ADL's:  Intact  Cognition:  WNL  Sleep:  Number of Hours: 5.25    Escalate Risperdal add lorazepam for purposes of disinhibiting and perhaps improving compliance continue current precautions discussed long-acting injectable again  Treatment Plan Summary: Daily contact with patient to assess and evaluate symptoms and progress in treatment and Medication management  Suzanne Corson, MD 04/03/2018, 10:12 AM

## 2018-04-03 NOTE — Progress Notes (Signed)
Patient ID: Suzanne [name], female   DOB: August 06, 1980, 37 y.o.   MRN: [id number]  D. Pt presents with an incongruent affect and guarded behavior. Pt adheres to medication regimen that the doctor prescribed today. Pt remains in her room for most of the day, seen pacing and holding her head in her hands. Pt laughs intermittently to herself.   A. Labs and vitals monitored. Pt given and educated on medications. Pt supported emotionally and encouraged to express concerns and ask questions.    R. Pt remains safe with 15 minute checks. Pt currently denies SI/HI and AVH and agrees to contact staff before acting on any harmful thoughts. Will continue POC.

## 2018-04-03 NOTE — Progress Notes (Addendum)
Patient ID: Charlene Brookerin Scaffidi, female   DOB: [birth year], 37 y.o.   MRN: [id number]  Pt took her morning dose of Celexa and Risperdal, afternoon multivitamin and evening dose of Abilify and Cogentin. Refused all other medications and cheeked evening medications. Pt paranoid delusions about medications, reports she does not plan on taking them at discharge. Pt currently in no distress. Lying in bed with her eyes open. Will continue to monitor.

## 2018-04-04 MED ORDER — BENZTROPINE MESYLATE 0.5 MG PO TABS: 0.5000 mg | ORAL_TABLET | Freq: Two times a day (BID) | ORAL | 1 refills | Status: AC

## 2018-04-04 MED ORDER — ARIPIPRAZOLE ER 400 MG IM SRER: 400.0000 mg | Freq: Once | INTRAMUSCULAR | 5 refills | Status: AC

## 2018-04-04 MED ORDER — RISPERIDONE 3 MG PO TABS: 3.0000 mg | ORAL_TABLET | Freq: Two times a day (BID) | ORAL | 2 refills | Status: AC

## 2018-04-04 MED ORDER — ARIPIPRAZOLE ER 400 MG IM SRER
400.0000 mg | Freq: Once | INTRAMUSCULAR | Status: AC
  Administered 2018-04-04: 400 mg via INTRAMUSCULAR

## 2018-04-04 NOTE — Progress Notes (Signed)
Recreation Therapy Notes  INPATIENT RECREATION TR PLAN  Patient Details Name: Suzanne [name] MRN: [id number] DOB: 1980/12/30 Today's Date: 04/04/2018  Rec Therapy Plan Is patient appropriate for Therapeutic Recreation?: Yes Treatment times per week: about 3 days Estimated Length of Stay: 5-7 days TR Treatment/Interventions: Group participation (Comment)  Discharge Criteria Pt will be discharged from therapy if:: Discharged Treatment plan/goals/alternatives discussed and agreed upon by:: Patient/family  Discharge Summary Short term goals set: See patient care plan Short term goals met: Not met Progress toward goals comments: Groups attended Which groups?: Self-esteem, Other (Comment)(Team building) Reason goals not met: Pt did not attend stress management group session Therapeutic equipment acquired: N/A Reason patient discharged from therapy: Discharge from hospital Pt/family agrees with progress & goals achieved: Yes Date patient discharged from therapy: 04/04/18     Suzanne [name], LRT/CTRS  Ria Comment, Anahlia Iseminger A 04/04/2018, 11:07 AM

## 2018-04-04 NOTE — Progress Notes (Addendum)
CSW met with patient at bedside to inquire if patient is receptive to outpatient resources for therapy or medication management. Patient agreeable at this time, no facility preference. Patient signed request and authorization for use/disclosure form.    Patient to follow up with [facility]. CSW left voicemail to schedule a hospital discharge appointment.  Stephanie Acre, [place] Social Worker [phone]

## 2018-04-04 NOTE — Progress Notes (Signed)
  [facility]  Will you be returning to the same living situation after discharge:  Yes,  patient states she is returning to her home At discharge, do you have transportation home?: Yes,  patient states a friend is picking her up Do you have the ability to pay for your medications: Yes,  Medicaid  Release of information consent forms completed and in the chart. CSW left printed shelter resource list and bus pass on chart for patient, should she need them.  Patient to Follow up at: Follow-up Information    [facility]. Go on 04/07/2018.   Why:  Please attend your hospital discharge appointment on 04/07/18 at 8:00am. Contact information: [address] [place] [address] [phone]           Next level of care provider [facility] Link:no  Safety Planning and Suicide Prevention discussed: Yes,  with patient.  Have you used any form of tobacco in the last 30 days? (Cigarettes, Smokeless Tobacco, Cigars, and/or Pipes): No  Has patient been referred to the Quitline?: N/A patient is not a smoker  Patient has been referred for addiction treatment: Yes  Darreld McleanCharlotte C Kree Armato, LCSWA 04/04/2018, 10:31 AM

## 2018-04-04 NOTE — [facility] Suicide Risk Assessment (Signed)
[facility]   Principal Problem: <principal problem not specified> Discharge Diagnoses: Active Problems:   MDD (major depressive disorder), recurrent, severe, with psychosis (HCC)   Total Time spent with patient: 45 minutes Musculoskeletal: Strength & Muscle Tone: within normal limits Gait & Station: normal Patient leans: N/A  Psychiatric Specialty Exam: Physical Exam  ROS  Blood pressure 125/81, pulse (!) 149, temperature 98.2 F (36.8 C), temperature source Oral, resp. rate 18, height 5\' 1"  (1.549 m), weight 67.1 kg, last menstrual period 02/20/2018, SpO2 99 %.Body mass index is 27.96 kg/m.  General Appearance: Casual  Eye Contact:  Fair  Speech:  Clear and Coherent  Volume:  Normal  Mood:  Euthymic  Affect:  Congruent  Thought Process:  Irrelevant  Orientation:  Full (Time, Place, and Person)  Thought Content:  Delusions  Suicidal Thoughts:  No  Homicidal Thoughts:  No  Memory:  Immediate;   Fair  Judgement:  Impaired  Insight:  Lacking  Psychomotor Activity:  Normal  Concentration:  Concentration: Fair  Recall:  Negative  Fund of Knowledge:  Fair  Language:  Good  Akathisia:  Negative  Handed:  Right  AIMS (if indicated):     Assets:  Communication Skills  ADL's:  Intact  Cognition:  WNL  Sleep:  Number of Hours: 4     Mental Status Per Nursing Assessment::   On Admission:  Suicidal ideation indicated by others, Self-harm behaviors  Demographic Factors:  Living alone  Loss Factors: Decrease in vocational status  Historical Factors: NA  Risk Reduction Factors:   NA  Continued Clinical Symptoms:  Dysthymia  Cognitive Features That Contribute To Risk:  Closed-mindedness    Suicide Risk:  Minimal: No identifiable suicidal ideation.  Patients presenting with no risk factors but with morbid ruminations; may be classified as minimal risk based on the severity of the depressive symptoms  Follow-up Information    [facility] .    Contact information: [address] [place] [address] [phone]           Plan Of Care/Follow-up recommendations:  Activity:  nl  Malvin JohnsFARAH,Herbie Lehrmann, MD 04/04/2018, 8:58 AM

## 2018-04-04 NOTE — Progress Notes (Signed)
Recreation Therapy Notes  Date: 12.10.19 Time: 1000 Location: 500 Hall Dayroom  Group Topic: Wellness  Goal Area(s) Addresses:  Patient will define components of whole wellness. Patient will verbalize benefit of whole wellness.  Intervention: Music  Activity: Exercise.  LRT and patients went through [name] series of stretches to loosen up their muscles and relieve some stress from their bodies.  Education: Wellness, Building control surveyorDischarge Planning.   Education Outcome: Acknowledges education/In group clarification offered/Needs additional education.   Clinical Observations/Feedback:  Pt did not attend group.     Suzanne [name], LRT/CTRS         Suzanne RancherLindsay, Suzanne [name] 04/04/2018 11:21 AM

## 2018-04-04 NOTE — [facility] Group Notes (Addendum)
LCSW Group Therapy Note 04/04/2018 12:02 PM  Type of Therapy and Topic: Group Therapy: DBT House  Participation Level: Minimal   Description of Group:  In this group patients will be encouraged to explore  their values, behaviors they want to change, emotions they wish to increase, protective factors, supports, coping skills, and motivational factors. They will be guided to discuss their thoughts, feelings, and behaviors related to these obstacles. The group will be asked to individually process the activity and share their insights with the group. This group will be process-oriented, with patients participating in exploration of their own experiences as well as giving and receiving support and challenge from other group members.   Therapeutic Goals: 1. Patient will identify their values 2. Patient will identify behaviors they wish to modify  3. Patient will identify feelings and emotions they wish to increase 4. Patient will identify strengths, supports, protective factors 5. Patient will identify coping skills 6. Patient will identify goals and motivating factors for change   Summary of Patient Progress Suzanne [name] attended group today and remained present, she listened quietly and responded directly to a group member when asked a question. Suzanne [name] stated a behavior she wants to modify is "procastination."  Therapeutic Modalities:  Dialectical Behavioral Therapy  Motivational Interviewing Relapse Prevention Therapy

## 2018-04-04 NOTE — Plan of Care (Signed)
Pt did not attend stress management group session.  Pt gave minimal to no participation in the groups in the groups she attend.    Caroll RancherMarjette Jarome Trull, LRT/CTRS

## 2018-04-04 NOTE — Progress Notes (Signed)
D: Pt A & O to self, place and situation. Denies SI, HI, AVH and pain at this time. "I'm ready to go  Home, the doctor said he will let me go home today". Pt discharge home as ordered. Bus pass given for transportation and family members (mother & cousin) contact information given to pt at time of d/c. A: D/C instructions reviewed with pt including prescriptions, medication samples and follow up appointment; compliance encouraged. All belongings from locker # 32 given to pt at time of departure. Scheduled medications given with verbal education and effects monitored. Safety checks maintained without incident till time of d/c.  R: Pt receptive to care. Compliant with medications when offered. Denies adverse drug reactions when assessed. Verbalized understanding related to d/c instructions. Signed belonging sheet in agreement with items received from locker. Ambulatory with a steady gait. Appears to be in no physical distress at time of departure.

## 2018-04-04 NOTE — Discharge Summary (Signed)
Physician Discharge Summary Note  Patient:  Suzanne [name] is an 36 y.o., female MRN:  [id number] DOB:  08-Jan-1981 Patient phone:  [phone] (home)  Patient address:   [place] [address],  Total Time spent with patient: 45 minutes  Date of Admission:  03/26/2018 Date of Discharge: 04/04/18  Reason for Admission: Cluster of psychotic symptoms, even involving recent dangerousness such as standing on a bridge, and this is in the context of an untreated schizophrenic disorder, chronic noncompliance, and a desire to be homeless and refusing housing when offered.  Principal Problem: Schizophrenic disorder Discharge Diagnoses: Active Problems:   MDD (major depressive disorder), recurrent, severe, with psychosis ([place])  Past Medical History:  Past Medical History:  Diagnosis Date  . Anxiety   . Fatty liver   . Peptic ulcer    History reviewed. No pertinent surgical history. Family History: History reviewed. No pertinent family history.  Social History:  Social History   Substance and Sexual Activity  Alcohol Use Not Currently  . Frequency: Never   Comment: Last use was March 2019     Social History   Substance and Sexual Activity  Drug Use Never    Social History   Socioeconomic History  . Marital status: Single    Spouse name: Not on file  . Number of children: Not on file  . Years of education: Not on file  . Highest education level: Not on file  Occupational History  . Occupation: Unemployed  Social Needs  . Financial resource strain: Not on file  . Food insecurity:    Worry: Not on file    Inability: Not on file  . Transportation needs:    Medical: Not on file    Non-medical: Not on file  Tobacco Use  . Smoking status: Never Smoker  . Smokeless tobacco: Never Used  Substance and Sexual Activity  . Alcohol use: Not Currently    Frequency: Never    Comment: Last use was March 2019  . Drug use: Never  . Sexual activity: Not Currently   Lifestyle  . Physical activity:    Days per week: Not on file    Minutes per session: Not on file  . Stress: Not on file  Relationships  . Social connections:    Talks on phone: Not on file    Gets together: Not on file    Attends religious service: Not on file    Active member of club or organization: Not on file    Attends meetings of clubs or organizations: Not on file    Relationship status: Not on file  Other Topics Concern  . Not on file  Social History Narrative   Pt stated that she is unemployed and currently without a fixed address.  Pt stated that she recently ran out of her Celexa.    Hospital Course:    Suzanne [name] was very difficult to treat she was chronically noncompliant and even noncompliant in the hospital.  Though she initially seemed to have more depressive symptoms she proved to be more psychotic than depressed.  She again refused medications and during multiple team discussions it was concluded that though she was psychotic, refusing meds, she never met the criteria for forced medication because she was calm and not behaving in any sort of dangerous fashion.  We did recommend and discussed risk benefits side effects of long-acting injectable and since she repeatedly requested discharge we told her this would be contingent upon her  receiving at least a long-acting injectable and she did agree to this so at the time of this dictation she is scheduled to get Abilify 400 mg IM today.  She has openly stated she will not comply with oral medications she is openly stated she does not want housing or placement she prefers to be homeless or stay in shelters.  She was also quite bizarre at times when the treatment team would walk into her room to do rounds she would say "go away I make it" but she was fully clothed and sometimes even closed and under the covers however since she tended to claim to be new to when she was not she was always seen with more than 1 staff member and  always a female staff member present.  Even though this was obviously a function of her delusional beliefs.  The other issue with Suzanne [name] as we never really were sure when she was malingering and when she was truly psychotic because of her homelessness she would be very gamy about discharge and desire for treatment.  She had also required a security escort to leave the premises at one point when she been discharged in the past.  But is far as diagnostic clarity I think she is more of a schizophrenic than a depression with psychosis diagnosis.  Physical Findings: AIMS: Facial and Oral Movements Muscles of Facial Expression: None, normal Lips and Perioral Area: None, normal Jaw: None, normal Tongue: None, normal,Extremity Movements Upper (arms, wrists, hands, fingers): None, normal Lower (legs, knees, ankles, toes): None, normal, Trunk Movements Neck, shoulders, hips: None, normal, Overall Severity Severity of abnormal movements (highest score from questions above): None, normal Incapacitation due to abnormal movements: None, normal Patient's awareness of abnormal movements (rate only patient's report): No Awareness, Dental Status Current problems with teeth and/or dentures?: No Does patient usually wear dentures?: No  CIWA:  CIWA-Ar Total: 2 COWS:  COWS Total Score: 2  Musculoskeletal: Strength & Muscle Tone: within normal limits Gait & Station: normal Patient leans: N/A  Psychiatric Specialty Exam: Physical Exam  ROS  Blood pressure 125/81, pulse (!) 149, temperature 98.2 F (36.8 C), temperature source Oral, resp. rate 18, height 5' 1" (1.549 m), weight 67.1 kg, last menstrual period 02/20/2018, SpO2 99 %.Body mass index is 27.96 kg/m.  General Appearance: Casual  Eye Contact:  Fair  Speech:  Clear and Coherent  Volume:  Normal  Mood:  Euthymic  Affect:  Congruent  Thought Process:  Irrelevant  Orientation:  Full (Time, Place, and Person)  Thought Content:  Delusions   Suicidal Thoughts:  No  Homicidal Thoughts:  No  Memory:  Immediate;   Fair  Judgement:  Impaired  Insight:  Lacking  Psychomotor Activity:  Normal  Concentration:  Concentration: Fair  Recall:  Negative  Fund of Knowledge:  Fair  Language:  Good  Akathisia:  Negative  Handed:  Right  AIMS (if indicated):     Assets:  Communication Skills  ADL's:  Intact  Cognition:  WNL  Sleep:  Number of Hours: 4     Have you used any form of tobacco in the last 30 days? (Cigarettes, Smokeless Tobacco, Cigars, and/or Pipes): No  Has this patient used any form of tobacco in the last 30 days? (Cigarettes, Smokeless Tobacco, Cigars, and/or Pipes) Yes, No  Blood Alcohol level:  Lab Results  Component Value Date   ETH <10 03/26/2018   ETH <10 80/88/1103    Metabolic Disorder Labs:  No results found  for: HGBA1C, MPG No results found for: PROLACTIN No results found for: CHOL, TRIG, HDL, CHOLHDL, VLDL, LDLCALC  See Psychiatric Specialty Exam and Suicide Risk Assessment completed by Attending Physician prior to discharge.  Discharge destination:  Home  Is patient on multiple antipsychotic therapies at discharge:  No   Has Patient had three or more failed trials of antipsychotic monotherapy by history:  No  Recommended Plan for Multiple Antipsychotic Therapies: Allergies as of 04/04/2018   No Known Allergies     Medication List    STOP taking these medications   cephALEXin 500 MG capsule Commonly known as:  KEFLEX     TAKE these medications     Indication  ARIPiprazole ER 400 MG Srer injection Commonly known as:  ABILIFY MAINTENA Inject 2 mLs (400 mg total) into the muscle once for 1 dose.  Indication:  Schizophrenia   benztropine 0.5 MG tablet Commonly known as:  COGENTIN Take 1 tablet (0.5 mg total) by mouth 2 (two) times daily.  Indication:  Extrapyramidal Reaction caused by Medications   citalopram 10 MG tablet Commonly known as:  CELEXA Take 1 tablet (10 mg total) by  mouth 2 (two) times daily. What changed:    medication strength  how much to take  when to take this  Indication:  Depression   clonazePAM 0.5 MG tablet Commonly known as:  KLONOPIN Take 0.5 tablets (0.25 mg total) by mouth 3 (three) times daily for 7 days.  Indication:  Manic-Depression   prenatal multivitamin Tabs tablet Take 1 tablet by mouth daily at 12 noon.  Indication:  Vitamin Deficiency   risperiDONE 3 MG tablet Commonly known as:  RISPERDAL Take 1 tablet (3 mg total) by mouth at bedtime.  Indication:  Major Depressive Disorder   risperiDONE 3 MG tablet Commonly known as:  RISPERDAL Take 1 tablet (3 mg total) by mouth 2 (two) times daily.  Indication:  Manic-Depression      Follow-up Information    N/A Follow up.   Why:  Patient declines follow up.           Follow-up recommendations:  Activity:  nl  SignedJohnn Hai, MD 04/04/2018, 8:52 AM

## 2019-10-13 IMAGING — CT CT HEAD W/O CM
3 series · 15 of 45 positions shown, 18 images · non-contrast
Comparison: None.

CLINICAL DATA: Initial evaluation for acute altered mental status.

EXAM:
CT HEAD WITHOUT CONTRAST
TECHNIQUE: Contiguous axial images were obtained from the base of the skull
through the vertex without intravenous contrast.

[Series 2: head wo · axial · 0.47mm/px · z∈[-74,+41]mm · 9 of 28 slices shown, 12 images]
[im 3/28  brain]
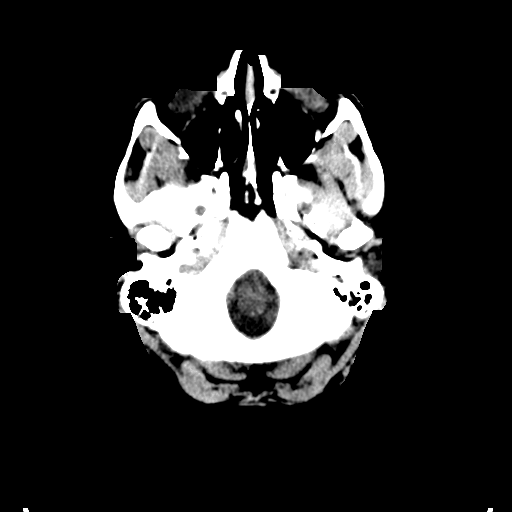
[im 3/28  bone]
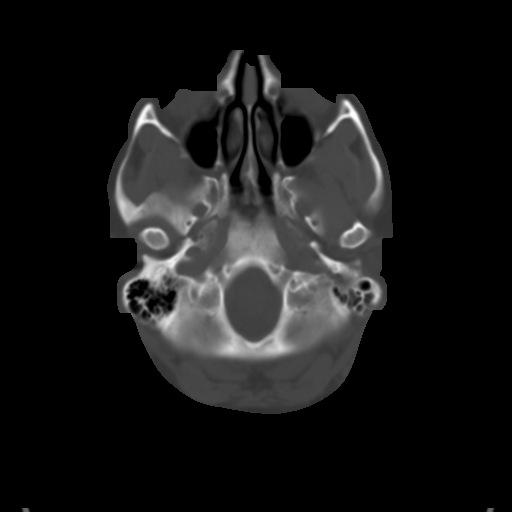
[im 6/28  brain]
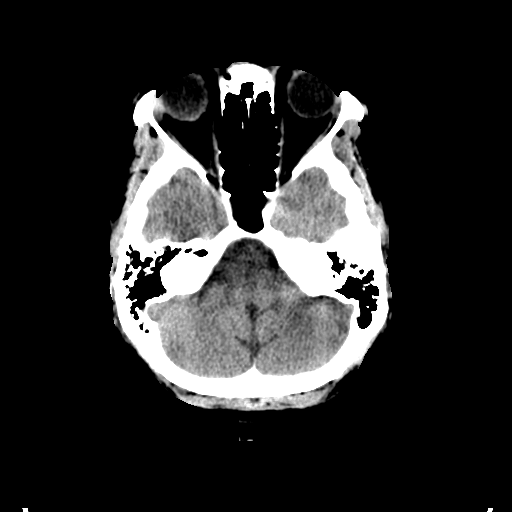
[im 9/28  brain]
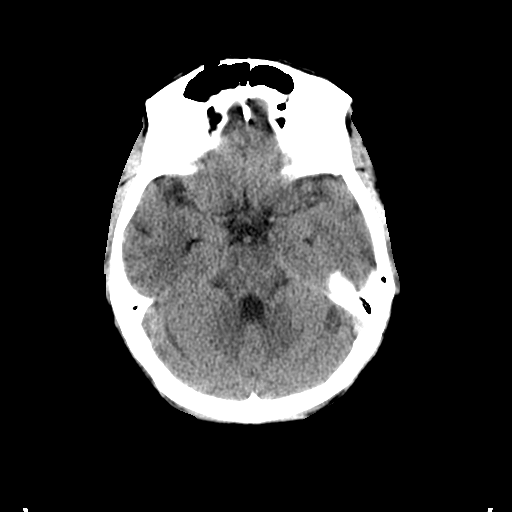
[im 12/28  brain]
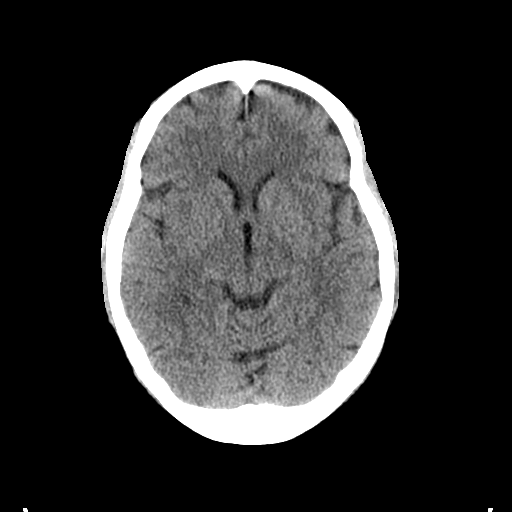
[im 15/28  brain]
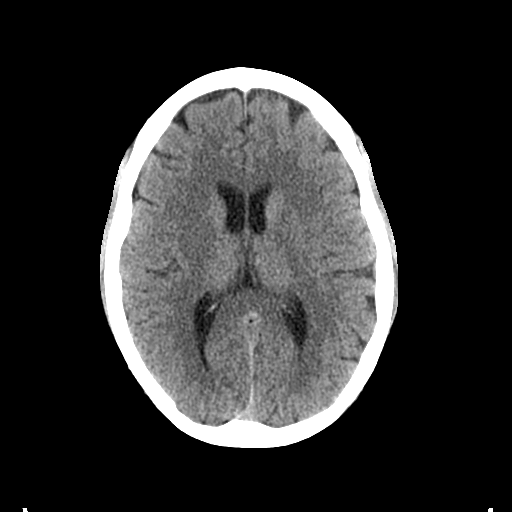
[im 15/28  bone]
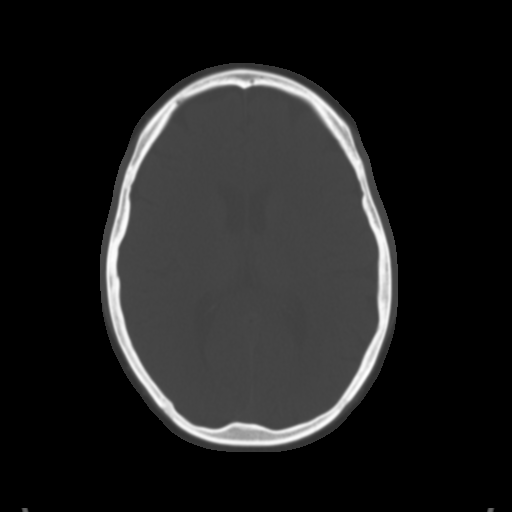
[im 17/28  brain]
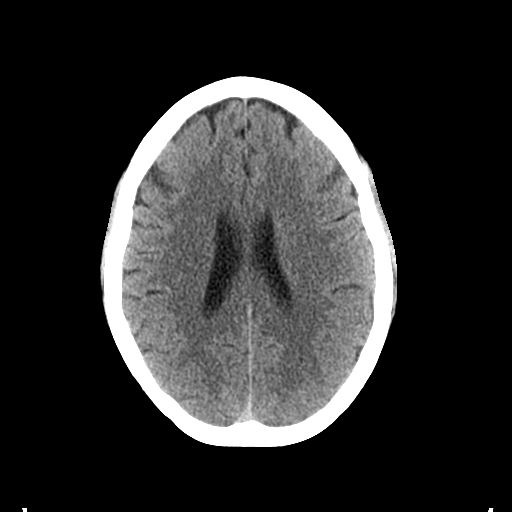
[im 20/28  brain]
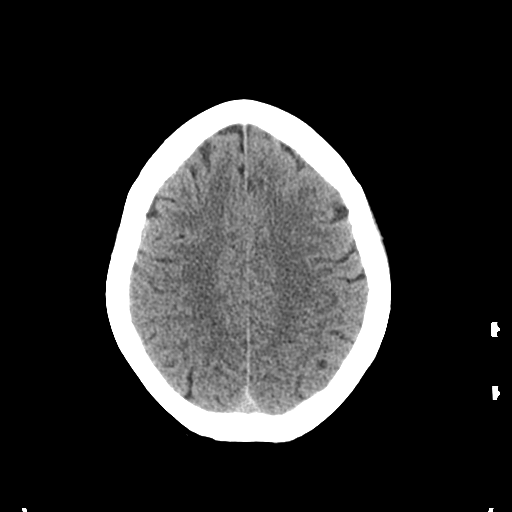
[im 23/28  brain]
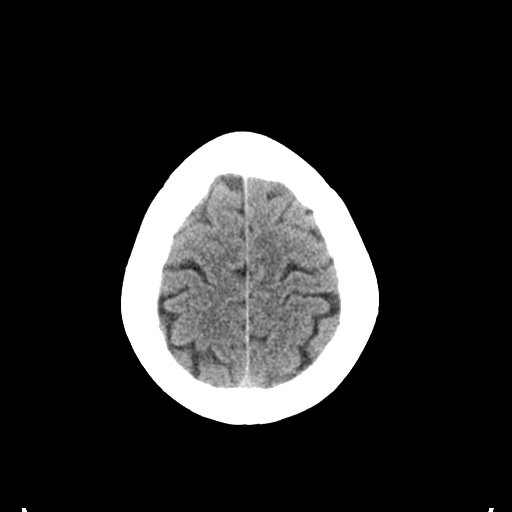
[im 26/28  brain]
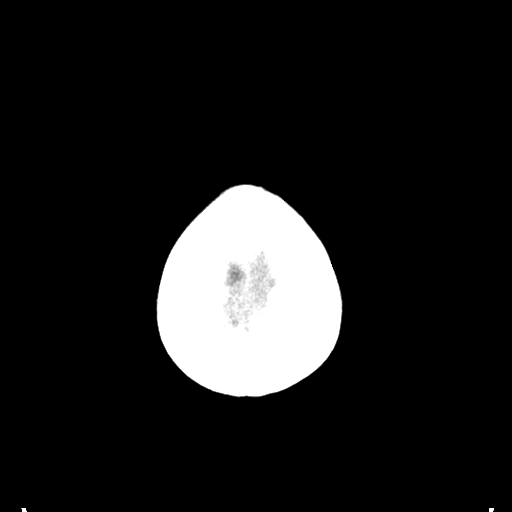
[im 26/28  bone]
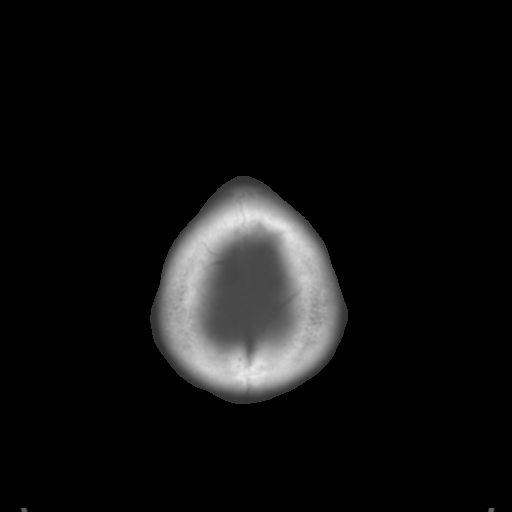

[Series 4: coronal soft tissue · coronal · 0.27mm/px · 3 of 61 slices shown]
[im 21/61  brain]
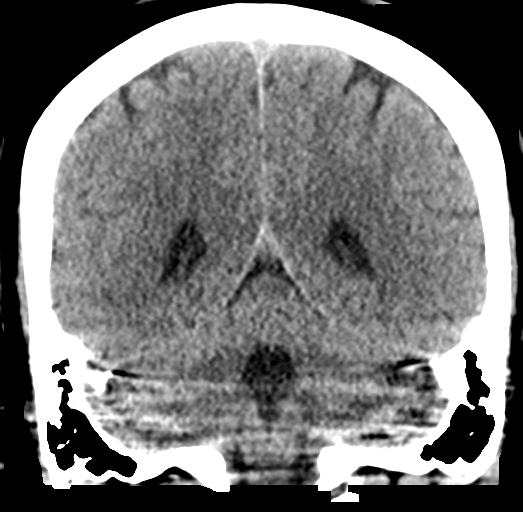
[im 27/61  brain]
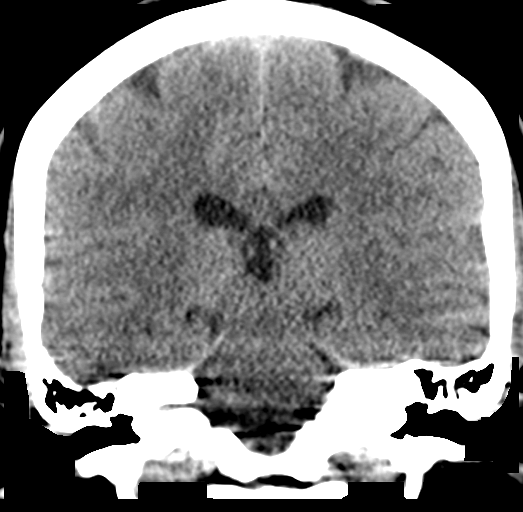
[im 34/61  brain]
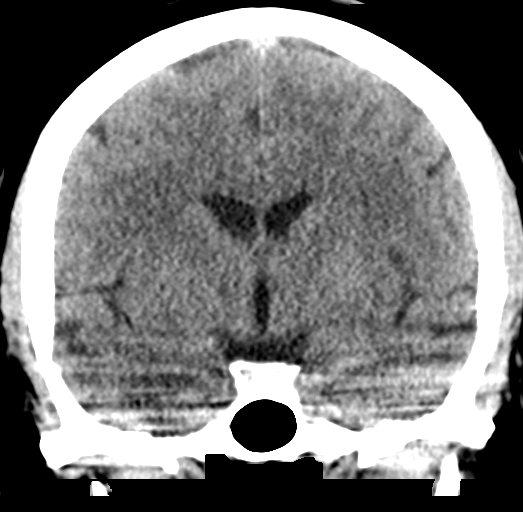

[Series 5: sagittal soft tissue · sagittal · 0.27mm/px · 3 of 47 slices shown]
[im 16/47  brain]
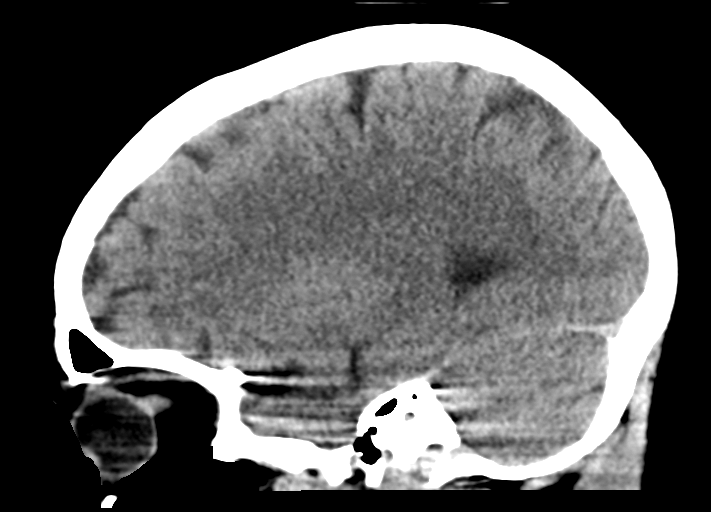
[im 24/47  brain]
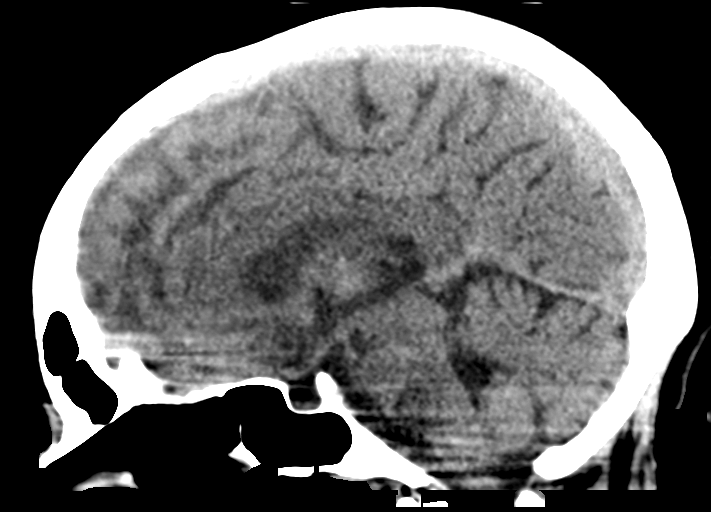
[im 31/47  brain]
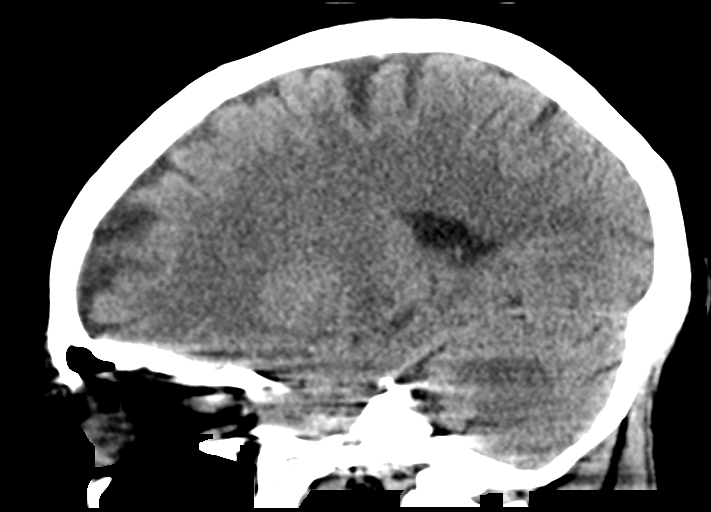

[15 of 45 positions shown; findings below may reference images not displayed]

FINDINGS: Brain: Cerebral volume within normal limits for patient age.

No evidence for acute intracranial hemorrhage. No findings to
suggest acute large vessel territory infarct. No mass lesion,
midline shift, or mass effect. Ventricles are normal in size without
evidence for hydrocephalus. No extra-axial fluid collection
identified.

Vascular: No hyperdense vessel identified.

Skull: Scalp soft tissues demonstrate no acute abnormality.
Calvarium intact.

Sinuses/Orbits: Globes and orbital soft tissues within normal
limits.

Visualized paranasal sinuses are clear. No mastoid effusion.
IMPRESSION: Negative head CT.  No acute intracranial abnormality identified.
